# Patient Record
Sex: Female | Born: 1960
Health system: Southern US, Community
[De-identification: ages and names within clinical notes are randomized; demographics above are authoritative.]

## PROBLEM LIST (undated history)

## (undated) DIAGNOSIS — G43909 Migraine, unspecified, not intractable, without status migrainosus: Secondary | ICD-10-CM

## (undated) DIAGNOSIS — B360 Pityriasis versicolor: Secondary | ICD-10-CM

## (undated) DIAGNOSIS — E785 Hyperlipidemia, unspecified: Secondary | ICD-10-CM

## (undated) DIAGNOSIS — F988 Other specified behavioral and emotional disorders with onset usually occurring in childhood and adolescence: Secondary | ICD-10-CM

## (undated) HISTORY — PX: OPEN ANTERIOR SHOULDER RECONSTRUCTION: SHX2100

## (undated) HISTORY — DX: Pityriasis versicolor: B36.0

## (undated) HISTORY — DX: Hyperlipidemia, unspecified: E78.5

## (undated) HISTORY — PX: TUBAL LIGATION: SHX77

## (undated) HISTORY — DX: Other specified behavioral and emotional disorders with onset usually occurring in childhood and adolescence: F98.8

## (undated) HISTORY — DX: Migraine, unspecified, not intractable, without status migrainosus: G43.909

## (undated) HISTORY — PX: SHOULDER ARTHROSCOPY: SHX128

## (undated) HISTORY — PX: LEEP: SHX91

---

## 1999-01-05 ENCOUNTER — Other Ambulatory Visit: Admission: RE | Admit: 1999-01-05 | Discharge: 1999-01-05 | Payer: Self-pay | Admitting: Obstetrics and Gynecology

## 1999-12-29 ENCOUNTER — Other Ambulatory Visit: Admission: RE | Admit: 1999-12-29 | Discharge: 1999-12-29 | Payer: Self-pay | Admitting: Obstetrics and Gynecology

## 2001-01-04 ENCOUNTER — Other Ambulatory Visit: Admission: RE | Admit: 2001-01-04 | Discharge: 2001-01-04 | Payer: Self-pay | Admitting: Obstetrics and Gynecology

## 2001-12-14 ENCOUNTER — Other Ambulatory Visit: Admission: RE | Admit: 2001-12-14 | Discharge: 2001-12-14 | Payer: Self-pay | Admitting: Obstetrics and Gynecology

## 2002-07-12 ENCOUNTER — Inpatient Hospital Stay (HOSPITAL_COMMUNITY): Admission: AD | Admit: 2002-07-12 | Discharge: 2002-07-16 | Payer: Self-pay | Admitting: Obstetrics and Gynecology

## 2002-07-13 ENCOUNTER — Encounter (INDEPENDENT_AMBULATORY_CARE_PROVIDER_SITE_OTHER): Payer: Self-pay

## 2002-08-27 ENCOUNTER — Other Ambulatory Visit: Admission: RE | Admit: 2002-08-27 | Discharge: 2002-08-27 | Payer: Self-pay | Admitting: Obstetrics and Gynecology

## 2003-03-19 ENCOUNTER — Other Ambulatory Visit: Admission: RE | Admit: 2003-03-19 | Discharge: 2003-03-19 | Payer: Self-pay | Admitting: Obstetrics and Gynecology

## 2003-10-06 ENCOUNTER — Inpatient Hospital Stay (HOSPITAL_COMMUNITY): Admission: AD | Admit: 2003-10-06 | Discharge: 2003-10-10 | Payer: Self-pay | Admitting: Obstetrics & Gynecology

## 2003-10-07 ENCOUNTER — Encounter (INDEPENDENT_AMBULATORY_CARE_PROVIDER_SITE_OTHER): Payer: Self-pay | Admitting: Specialist

## 2003-11-11 ENCOUNTER — Other Ambulatory Visit: Admission: RE | Admit: 2003-11-11 | Discharge: 2003-11-11 | Payer: Self-pay | Admitting: Obstetrics and Gynecology

## 2005-03-12 ENCOUNTER — Other Ambulatory Visit: Admission: RE | Admit: 2005-03-12 | Discharge: 2005-03-12 | Payer: Self-pay | Admitting: Obstetrics and Gynecology

## 2008-04-23 ENCOUNTER — Ambulatory Visit: Payer: Self-pay | Admitting: Internal Medicine

## 2008-05-15 ENCOUNTER — Encounter: Admission: RE | Admit: 2008-05-15 | Discharge: 2008-08-07 | Payer: Self-pay | Admitting: Psychologist

## 2008-11-12 ENCOUNTER — Ambulatory Visit: Payer: Self-pay | Admitting: Internal Medicine

## 2008-12-16 ENCOUNTER — Ambulatory Visit: Payer: Self-pay | Admitting: Psychology

## 2009-08-19 ENCOUNTER — Encounter: Admission: RE | Admit: 2009-08-19 | Discharge: 2009-08-19 | Payer: Self-pay | Admitting: Orthopedic Surgery

## 2009-09-03 ENCOUNTER — Ambulatory Visit (HOSPITAL_BASED_OUTPATIENT_CLINIC_OR_DEPARTMENT_OTHER): Admission: RE | Admit: 2009-09-03 | Discharge: 2009-09-04 | Payer: Self-pay | Admitting: Orthopedic Surgery

## 2010-07-21 ENCOUNTER — Other Ambulatory Visit: Payer: Self-pay | Admitting: Internal Medicine

## 2010-07-23 ENCOUNTER — Encounter (INDEPENDENT_AMBULATORY_CARE_PROVIDER_SITE_OTHER): Payer: 59 | Admitting: Internal Medicine

## 2010-07-23 DIAGNOSIS — F329 Major depressive disorder, single episode, unspecified: Secondary | ICD-10-CM

## 2010-07-23 DIAGNOSIS — F3289 Other specified depressive episodes: Secondary | ICD-10-CM

## 2010-07-23 DIAGNOSIS — G43909 Migraine, unspecified, not intractable, without status migrainosus: Secondary | ICD-10-CM

## 2010-08-24 ENCOUNTER — Other Ambulatory Visit: Payer: 59 | Admitting: Internal Medicine

## 2010-09-03 ENCOUNTER — Telehealth: Payer: Self-pay | Admitting: Internal Medicine

## 2010-09-03 DIAGNOSIS — R52 Pain, unspecified: Secondary | ICD-10-CM

## 2010-09-03 MED ORDER — CYCLOBENZAPRINE HCL 10 MG PO TABS: 10.0000 mg | ORAL_TABLET | Freq: Two times a day (BID) | ORAL | Status: AC | PRN

## 2010-09-03 NOTE — Telephone Encounter (Signed)
Call in Flexeri l10 mg #60- one p.o. Bid with prn one year refills

## 2010-09-11 NOTE — Discharge Summary (Signed)
NAME:  Susan Rivas, Susan Rivas                         ACCOUNT NO.:  [id number]   MEDICAL RECORD NO.:  [id number]                   PATIENT TYPE:  INP   LOCATION:  9102                                 FACILITY:  [facility]   PHYSICIAN:  Michelle L. Vincente Poli, M.D.            DATE OF BIRTH:  12/27/1960   DATE OF ADMISSION:  07/12/2002  DATE OF DISCHARGE:  07/16/2002                                 DISCHARGE SUMMARY   ADMITTING DIAGNOSES:  1. Intrauterine pregnancy at term.  2. Spontaneous onset of labor.   DISCHARGE DIAGNOSES:  1. Status post low transverse cesarean section secondary to maternal fever     and nonreassuring fetal heart tones.  2. Viable female infant.   PROCEDURE:  Primary low transverse cesarean section.   REASON FOR ADMISSION:  Please see written H&P.   HOSPITAL COURSE:  The patient was a 50 year old married female primigravida  who was admitted to the [facility] at term in labor.  Cervix was 3 cm  dilated, 80% effaced, vertex presentation.  Artificial rupture of membranes  was performed revealing clear fluid.  Fetal heart tones were noted with  occasional variable deceleration pattern, but overall was reassuring.  Intrauterine pressure catheter was placed for adequate monitoring and fetal  scalp lead was placed.  Epidural was placed for patient's comfort.  Fetal  deceleration pattern continued and amniotic infusion was started with  improvement in pattern.  Later, maternal temperature developed and patient  was placed on IV antibiotics.  Fetal heart rate pattern was noted to have  persistent deceleration pattern and decision was made to proceed with a  cesarean delivery.  The patient was taken to the operating room where  epidural was dosed to an adequate surgical level.  A low transverse incision  was made with the delivery of a viable female infant weighing 6 pounds 1  ounce with Apgars of 8 at one minute, 9 at five minutes.  Umbilical cord pH  was 7.11.  The patient  tolerated procedure well and was taken to the  recovery room in stable condition.  On postoperative day one vital signs  were stable with temperature maximum of 100 degrees during the night with  99.0 degrees Fahrenheit this morning.  Abdomen was soft with good return of  bowel function.  Fundus was firm and mildly tender.  Laboratories revealed  hemoglobin of 10.0 and WBC count of 19.4.  On postoperative day two abdomen  was soft.  Abdominal dressing was removed revealing incision that was clean,  dry, and intact.  Vital signs were stable and patient was afebrile.  The  patient was ambulating well and tolerating a regular diet without complaints  of nausea and vomiting.  Laboratories revealed hemoglobin of 8.6 and WBC  count of 15.3.  On postoperative day three temperature had spiked at 4 p.m.  the previous day to 100.3 with temperature normal at 97.4 at 8 a.m.  Abdomen  was soft.  Fundus was firm and nontender.  Incision was clean, dry, and  intact.  Staples were removed.  CBC was redone and revealed a hemoglobin of  9.1, WBC count of 7.2.  The patient was therefore discharged home.   CONDITION ON DISCHARGE:  Good.   DIET:  Regular, as tolerated.   ACTIVITY:  No heavy lifting.  No driving for two weeks.  No vaginal entry.   FOLLOW UP:  The patient is to follow up in the office in one week for an  incision check.  She is to call for temperature greater than 100 degrees,  persistent nausea and vomiting, heavy vaginal bleeding, and/or redness or  drainage from incisional site.   DISCHARGE MEDICATIONS:  1. Percocet 5/325 numbers 30 one p.o. q.4-6h. p.r.n. pain.  2. Motrin 600 mg q.6h. p.r.n.  3. Colace one p.o. daily p.r.n.  4. Prenatal vitamins one p.o. daily.     Julio Sicks, N.P.                        Stann Mainland. Vincente Poli, M.D.    CC/MEDQ  D:  08/21/2002  T:  08/21/2002  Job:  [id number]

## 2010-09-11 NOTE — Op Note (Signed)
NAMEALESANA, MAGISTRO                         ACCOUNT NO.:  [id number]   MEDICAL RECORD NO.:  [id number]                   PATIENT TYPE:  MAT   LOCATION:  MATC                                 FACILITY:  [facility]   PHYSICIAN:  Freddy Finner, M.D.                DATE OF BIRTH:  09/07/1960   DATE OF PROCEDURE:  10/07/2003  DATE OF DISCHARGE:                                 OPERATIVE REPORT   PREOPERATIVE DIAGNOSES:  1. Intrauterine pregnancy at term, surgically scarred uterus, presents in     labor.  2. Multiparity, requests surgical sterilization.   POSTOPERATIVE DIAGNOSES:  1. Intrauterine pregnancy at term, surgically scarred uterus, presents in     labor.  2. Multiparity, requests surgical sterilization.   OPERATIVE PROCEDURE:  Repeat cesarean delivery with viable female infant, 8  and 9 Apgars, cord pH 7.37, and bilateral tubal ligation.   SURGEON:  Freddy Finner, M.D.   ANESTHESIA:  Spinal.   ESTIMATED INTRAOPERATIVE BLOOD LOSS:  800 mL.   INTRAOPERATIVE COMPLICATIONS:  None.   INDICATION:  The patient presented in labor.  She has a surgically scarred  uterus.  She was originally scheduled for surgery in approximately 2 weeks  but was recently rescheduled for later this week.  She presented in labor  this evening.   INTRAOPERATIVE FINDINGS:  The patient had at least 3 myomas measuring 1.5 to  2.0 cm on the posterior fundus of the uterus.  The fallopian tubes and  ovaries were normal.   DESCRIPTION OF PROCEDURE:  The patient was brought to the operating room,  placed under adequate spinal anesthesia, placed in the dorsal recumbent  position with elevation of the right hip by 15 degrees.  Abdomen was prepped  and draped in usual fashion.  A Foley catheter was placed using sterile  technique.  A lower abdominal transverse incision was made through an old  scar and carried sharply down to fascia.  Fascia was entered sharply and  extended to the extent of the skin incision.   Rectus sheath was developed  superiorly and inferiorly with blunt and sharp dissection.  Rectus muscle  was divided in the midline; this included entering the peritoneum, which was  then extended under direct visualization with blunt and sharp dissection.  Bladder blade was placed.  A transverse incision was made in the lower  uterine segment above the reflection of the bladder, precluding developing  the bladder flap.  Uterine incision was extended transversely with blunt  dissection and membranes was ruptured.  Fetal vertex was floating.  The  incision was T'd and then the Kiwi vacuum device was applied with easy  delivery then of a viable female infant with  Apgars and cord pH noted above.  Placenta and other products of conception were removed from the uterus.  Uterus was delivered into the anterior abdominal wall.  The transverse  incision in the uterus was  closed with a running-locking 0 Monocryl suture.  A figure-of-eight at the right margin was required for complete hemostasis.  A small defect vertically was closed with 2 figure-of-eights of 0 Monocryl.  The midportion of each fallopian tube was then elevated, doubly ligated with  0 plain ties and a segment of tube excised.  The tube distal to the tie was  fulgurated on the mucosal segment.  On attempt to deliver the uterus back  into the abdomen, the tubes avulsed and required re-ligation of each distal  segment; on the right it was ligated with just 0 plain tie and on the left  side, the ties were still intact but there was a defect in the mesosalpinx  and the defect was crossclamped with a Tresa Endo and another 0 plain tie  applied; this produced complete hemostasis.  Irrigation was carried out;  hemostasis throughout was felt to be adequate.  The abdominal incision was  then closed in layers; running 0 Monocryl was used to close the peritoneum  and reapproximate the rectus muscles, fascia was closed with running 0 PDS,  subcutaneous  tissue was approximated with running 2-0 plain, skin was closed  with wide skin staples and quarter-inch Steri-Strips.  Patient tolerated the  operative procedure well and was taken to recovery in good condition.                                               Freddy Finner, M.D.    WRN/MEDQ  D:  10/07/2003  T:  10/07/2003  Job:  [id number]

## 2010-09-11 NOTE — Op Note (Signed)
NAME:  Susan Rivas, Susan Rivas                         ACCOUNT NO.:  [id number]   MEDICAL RECORD NO.:  [id number]                   PATIENT TYPE:  INP   LOCATION:  9102                                 [facility]ACILITY:  [facility]   PHYSICIAN:  Tracie Harrier, M.D.              DATE OF BIRTH:  13-Feb-1961   DATE OF PROCEDURE:  07/13/2002  DATE OF DISCHARGE:                                 OPERATIVE REPORT   PREOPERATIVE DIAGNOSES:  1. Intrauterine pregnancy at term.  2. Maternal fever.  3. Nonreassuring fetal heart tracing.   POSTOPERATIVE DIAGNOSES:  1. Intrauterine pregnancy at term.  2. Maternal fever.  3. Nonreassuring fetal heart tracing.   PROCEDURE:  Primary low transverse cesarean section.   SURGEON:  Tracie Harrier, M.D.   ANESTHESIA:  Epidural.   ESTIMATED BLOOD LOSS:  800 cc.   COMPLICATIONS:  None.   FINDINGS:  At [phone] through a low transverse uterine incision, a viable  female was delivered from the vertex presentation.  Light meconium was  encountered.  The baby did well.   The pelvis was visualized at time of surgery and noted to be normal.  There  was a 3 x 3 cm posterior fundal fibroid noted.   PROCEDURE:  The patient was taken to the operating room where an adequate  level of epidural anesthetic was administered.  The patient was placed on  the operating table in the left lateral tilt position.  The abdomen was  prepped and draped in the usual sterile fashion with Betadine and sterile  drapes.  A Foley catheter had previously been inserted.  The abdomen was  entered through a Pfannenstiel incision and carried down sharply in the  usual fashion.  The peritoneum was atraumatically entered.  The  vesicouterine peritoneum overlying the lower uterine segment was incised and  a bladder flap was bluntly created and sharply created over the lower  uterine segment.  A bladder blade was then placed behind the bladder to  ensure its protection during the procedure.  The uterus  was then entered  through a low transverse incision and carried out laterally using the  operator's fingers.  The membranes were entered with light meconium noted.  The vertex was elevated into the incision and delivered promptly and easily  at 0646.  The oropharynx and nasopharynx was thoroughly bulb suctioned.  The  baby was handed promptly to the pediatricians.  The baby did well with a  strong spontaneous cry.  Apgars were 8 and 9.  The placenta was then removed manually and the interior of the uterus was  wiped clean thoroughly with a wet sponge.  A cord gas was obtained prior to  this with a pH showing 7.11.  Again, the baby did well.   Attention was then turned to closure.  The uterine incision was closed in a  two layer fashion.  The first layer a running interlocking  suture of 1  Vicryl.  A second imbricating suture was placed across the primary suture  line with a running stitch of 1 Vicryl as well.  Good hemostasis was then  noted.  The pelvis was visualized.  A uterine fibroid was noted, 3 x 3 cm on  the posterior aspect and the superior fundal region of the uterus.  Otherwise, the ovaries and tubes appeared to be normal.  The pelvis was then  thoroughly irrigated with copious amounts of irrigant and noted to be  hemostatic.  The rectus muscle and anterior peritoneum was then closed with  a running suture of 1 Vicryl.  The subfascial layers were hemostatic.  The  fascia was then closed with a running suture of 1 Vicryl.  The subcutaneous  tissue was irrigated and made hemostatic using the Bovie cautery.  The skin  reapproximated with staples and a sterile dressing applied.   Final sponge, instrument, and needle counts correct x3.  There were no  perioperative complications.  The patient did receive intraoperative  antibiotic.                                               Tracie Harrier, M.D.    REG/MEDQ  D:  07/13/2002  T:  07/14/2002  Job:  [id number]

## 2010-09-11 NOTE — Discharge Summary (Signed)
Susan Rivas, Susan Rivas                         ACCOUNT NO.:  [id number]   MEDICAL RECORD NO.:  [id number]                   PATIENT TYPE:  INP   LOCATION:  9122                                 [facility]   PHYSICIAN:  Freddy Finner, M.D.                DATE OF BIRTH:  09/16/1960   DATE OF ADMISSION:  10/06/2003  DATE OF DISCHARGE:  10/10/2003                                 DISCHARGE SUMMARY   ADMISSION DIAGNOSES:  1. Intrauterine pregnancy at term.  2. Labor.  3. Previous cesarean section, desires repeat.  4. Multiparity, desires sterilization.   DISCHARGE DIAGNOSES:  1. Status post low transverse cesarean section.  2. Viable female infant.  3. Permanent sterilization.   PROCEDURE:  1. Repeat low transverse cesarean section.  2. Bilateral tubal ligation.   REASON FOR ADMISSION:  Please see written H&P.   HOSPITAL COURSE:  The patient is a 50 year old white married female that  presents to [facility] at 38-1/2 weeks estimated gestational age in  labor.  The patient had a previous cesarean section and was scheduled for  cesarean in approximately 2 weeks.  The patient had previously also  requested permanent sterilization due to multiparity.  The patient was then  transferred to the operating room where spinal anesthesia was admitted  without difficulty.  A low transverse incision was made with the delivery of  a viable female infant weighing 6 pounds, 0 ounces, Apgar's of 8 at 1 minute,  9 at 5 minutes.  Periumbilical cord pH was 7.37.  The patient tolerated the  procedure well and was taken to the recovery room in stable condition.  On  postoperative day 1 patient was without complaint.  Vital signs were stable,  fundus was firm and nontender.  Abdomen was soft with good return of bowel  function.  Abdominal dressing was noted to be clean, dry and intact.  Labs  revealed a hemoglobin of 10.2.   On postoperative day 2 the patient did complain of some soreness; vital  signs were stable with blood pressure 110 to 137/69 to 79.  Abdominal  dressing had been removed revealing an incision that was clean, dry and  intact.  Labs revealed hemoglobin 10.6, WBC count of 6900, platelet count of  229,000.  On postoperative day 3 patient was without complaint.  Vital signs  were stable; fundus was firm and nontender, incision was clean, dry and  intact.  Staples were removed and patient was discharged home.   Condition on discharge good.  Diet regular as tolerated.  Activity - no  heavy lifting, no driving x2 weeks.  No vaginal entry.   FOLLOW UP:  The patient is to follow up in the office in 1-2 weeks for an  incision check.  She is to call for temperature greater than 100 degrees,  persistent nausea and vomiting, heavy vaginal bleeding, any redness or  drainage from incisional  site.   DISCHARGE MEDICATIONS:  1. Percocet 5/325 #30 1 p.o. q.4-6h p.r.n.  2. Motrin 600 mg q.6h.  3. Prenatal vitamins 1 p.o. daily.  4. Colace daily p.r.n.     Julio Sicks, N.P.                        Freddy Finner, M.D.    CC/MEDQ  D:  10/21/2003  T:  10/22/2003  Job:  [id number]

## 2010-10-23 ENCOUNTER — Encounter: Payer: Self-pay | Admitting: Internal Medicine

## 2010-10-27 ENCOUNTER — Encounter: Payer: Self-pay | Admitting: Internal Medicine

## 2010-10-27 ENCOUNTER — Ambulatory Visit (INDEPENDENT_AMBULATORY_CARE_PROVIDER_SITE_OTHER): Payer: 59 | Admitting: Internal Medicine

## 2010-10-27 ENCOUNTER — Ambulatory Visit: Payer: 59 | Admitting: Internal Medicine

## 2010-10-27 VITALS — BP 129/78 | HR 80 | Temp 98.8°F | Ht 65.0 in | Wt 132.0 lb

## 2010-10-27 DIAGNOSIS — R5383 Other fatigue: Secondary | ICD-10-CM

## 2010-10-27 DIAGNOSIS — R5381 Other malaise: Secondary | ICD-10-CM

## 2010-10-27 NOTE — Patient Instructions (Signed)
Continue Zoloft 100 mg daily. We will notify you by letter of your lab results. Return when necessary

## 2010-10-27 NOTE — Progress Notes (Signed)
  Subjective:    Patient ID: Susan Rivas, female    DOB: 1960/11/10, 50 y.o.   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 Recently she tried to obtain re-employment at local hospital but did not get a position. She is a bit down about that. She is on Zoloft 100 mg daily. Says marriage is going okay but she is chronically fatigued and sleeps 12-14 hours at a time some days. Has 2 children. Drives one hour to and from work. Worried about anemia. Baseline hemoglobin for years has been around 11.6 g    Review of Systems appetite okay. Energy level decreased. No headaches. No myalgias.     Objective:   Physical Exam neck is supple without thyromegaly; chest is clear, cardiac exam: regular rate and rhythm. TMs and pharynx are clear. Brief neurological exam no focal deficits.        Assessment & Plan:  1-fatigue--- etiology unclear other than busy schedule working full-time and being a mother to 2 children. Some dysthymia which has been chronic since losing job with local hospital. Did take attention deficit medication for a while but does not want to be back on that. In my opinion, has a combination of depression, attention deficit, and learning disability. This is affected her self esteem a great deal. Does not want to go to counseling at this point in time. Have obtained anemia studies today and repeat thyroid functions. The thyroid functions were normal in March 2012. Says she would like to go to [employer] school but husband does not think they can afford it right now. Consider job change.

## 2010-10-28 LAB — CBC WITH DIFFERENTIAL/PLATELET
Eosinophils Relative: 1 % (ref 0–5)
Hemoglobin: 11.8 g/dL — ABNORMAL LOW (ref 12.0–15.0)
Lymphocytes Relative: 38 % (ref 12–46)
Lymphs Abs: 2 10*3/uL (ref 0.7–4.0)
MCH: 28.2 pg (ref 26.0–34.0)
MCHC: 32.9 g/dL (ref 30.0–36.0)
MCV: 85.9 fL (ref 78.0–100.0)
Monocytes Absolute: 0.4 10*3/uL (ref 0.1–1.0)
Monocytes Relative: 7 % (ref 3–12)
Neutro Abs: 2.9 10*3/uL (ref 1.7–7.7)
Neutrophils Relative %: 54 % (ref 43–77)
Platelets: 295 10*3/uL (ref 150–400)

## 2010-10-28 LAB — IRON AND TIBC: UIBC: 416 ug/dL

## 2010-10-29 ENCOUNTER — Encounter: Payer: Self-pay | Admitting: Internal Medicine

## 2011-01-15 ENCOUNTER — Other Ambulatory Visit: Payer: 59 | Admitting: Internal Medicine

## 2011-01-18 ENCOUNTER — Other Ambulatory Visit: Payer: 59 | Admitting: Internal Medicine

## 2011-01-18 ENCOUNTER — Ambulatory Visit: Payer: 59 | Admitting: Internal Medicine

## 2011-01-18 DIAGNOSIS — E785 Hyperlipidemia, unspecified: Secondary | ICD-10-CM

## 2011-01-18 DIAGNOSIS — E611 Iron deficiency: Secondary | ICD-10-CM

## 2011-01-18 LAB — LIPID PANEL
Cholesterol: 223 mg/dL — ABNORMAL HIGH (ref 0–200)
HDL: 59 mg/dL (ref >39)
LDL Cholesterol: 147 mg/dL — ABNORMAL HIGH (ref 0–99)
Total CHOL/HDL Ratio: 3.8 Ratio
Triglycerides: 83 mg/dL (ref <150)
VLDL: 17 mg/dL (ref 0–40)

## 2011-01-19 ENCOUNTER — Ambulatory Visit: Payer: 59 | Admitting: Internal Medicine

## 2011-01-21 ENCOUNTER — Encounter: Payer: Self-pay | Admitting: Internal Medicine

## 2011-01-21 ENCOUNTER — Ambulatory Visit (INDEPENDENT_AMBULATORY_CARE_PROVIDER_SITE_OTHER): Payer: 59 | Admitting: Internal Medicine

## 2011-01-21 DIAGNOSIS — R5381 Other malaise: Secondary | ICD-10-CM

## 2011-01-21 DIAGNOSIS — F3289 Other specified depressive episodes: Secondary | ICD-10-CM

## 2011-01-21 DIAGNOSIS — E611 Iron deficiency: Secondary | ICD-10-CM | POA: Insufficient documentation

## 2011-01-21 DIAGNOSIS — E785 Hyperlipidemia, unspecified: Secondary | ICD-10-CM

## 2011-01-21 DIAGNOSIS — F329 Major depressive disorder, single episode, unspecified: Secondary | ICD-10-CM

## 2011-01-21 DIAGNOSIS — F32A Depression, unspecified: Secondary | ICD-10-CM | POA: Insufficient documentation

## 2011-01-21 DIAGNOSIS — R5383 Other fatigue: Secondary | ICD-10-CM

## 2011-01-21 NOTE — Progress Notes (Signed)
  Subjective:    Patient ID: Susan Rivas, female    DOB: Oct 22, 1960, 50 y.o.   MRN: [id number]  HPI patient here today to followup on iron deficiency and hyperlipidemia. Says she's been following a fairly strict low fat diet. 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 Continues to have some fatigue issues. Has 2 small children. 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    Review of Systems     Objective:   Physical Exam chest clear; cardiac exam regular rate and rhythm, no thyromegaly        Assessment & Plan:  Hyperlipidemia-fasting lipid panel reviewed with her. No significant change in my opinion although the numbers are slightly lower. Family history of hyperlipidemia in her mother. She may wind up on lipid-lowering medication  Depression related to employment and low self-esteem  History of iron deficiency-iron level now low normal. She is taking iron supplementation once a day and should increase that probably to twice daily. Return in 6 months for checkup and fasting lab work including lipid panel. Will get flu shot through her employment

## 2011-02-14 ENCOUNTER — Inpatient Hospital Stay (INDEPENDENT_AMBULATORY_CARE_PROVIDER_SITE_OTHER)
Admission: RE | Admit: 2011-02-14 | Discharge: 2011-02-14 | Disposition: A | Discharge status: Home / Self Care | Payer: 59 | Source: Ambulatory Visit | Attending: Family Medicine | Admitting: Family Medicine

## 2011-02-14 DIAGNOSIS — H8309 Labyrinthitis, unspecified ear: Secondary | ICD-10-CM

## 2011-02-21 ENCOUNTER — Other Ambulatory Visit: Payer: Self-pay | Admitting: Internal Medicine

## 2011-05-09 ENCOUNTER — Other Ambulatory Visit: Payer: Self-pay | Admitting: Internal Medicine

## 2011-07-16 ENCOUNTER — Other Ambulatory Visit: Payer: 59 | Admitting: Internal Medicine

## 2011-07-19 ENCOUNTER — Encounter: Payer: 59 | Admitting: Internal Medicine

## 2011-09-09 ENCOUNTER — Ambulatory Visit (INDEPENDENT_AMBULATORY_CARE_PROVIDER_SITE_OTHER): Payer: 59 | Admitting: Internal Medicine

## 2011-09-09 ENCOUNTER — Encounter: Payer: Self-pay | Admitting: Internal Medicine

## 2011-09-09 VITALS — BP 122/80 | HR 76 | Temp 97.6°F | Ht 64.5 in | Wt 135.0 lb

## 2011-09-09 DIAGNOSIS — F329 Major depressive disorder, single episode, unspecified: Secondary | ICD-10-CM

## 2011-09-09 DIAGNOSIS — R82998 Other abnormal findings in urine: Secondary | ICD-10-CM

## 2011-09-09 DIAGNOSIS — Z0000 Encounter for general adult medical examination without abnormal findings: Secondary | ICD-10-CM

## 2011-09-09 DIAGNOSIS — F32A Depression, unspecified: Secondary | ICD-10-CM

## 2011-09-09 DIAGNOSIS — F3289 Other specified depressive episodes: Secondary | ICD-10-CM

## 2011-09-09 DIAGNOSIS — R5381 Other malaise: Secondary | ICD-10-CM

## 2011-09-09 DIAGNOSIS — F988 Other specified behavioral and emotional disorders with onset usually occurring in childhood and adolescence: Secondary | ICD-10-CM

## 2011-09-09 DIAGNOSIS — R5383 Other fatigue: Secondary | ICD-10-CM

## 2011-09-09 LAB — POCT URINALYSIS DIPSTICK
Bilirubin, UA: NEGATIVE
Glucose, UA: NEGATIVE
Ketones, UA: NEGATIVE
Protein, UA: NEGATIVE
Spec Grav, UA: 1.01
Urobilinogen, UA: NEGATIVE
pH, UA: 7.5

## 2011-09-09 LAB — COMPREHENSIVE METABOLIC PANEL
ALT: 9 U/L (ref 0–35)
Albumin: 4.6 g/dL (ref 3.5–5.2)
Total Bilirubin: 0.5 mg/dL (ref 0.3–1.2)
Total Protein: 6.9 g/dL (ref 6.0–8.3)

## 2011-09-09 LAB — LIPID PANEL
Cholesterol: 250 mg/dL — ABNORMAL HIGH (ref 0–200)
Total CHOL/HDL Ratio: 4.1 Ratio
Triglycerides: 82 mg/dL (ref <150)
VLDL: 16 mg/dL (ref 0–40)

## 2011-09-09 NOTE — Progress Notes (Signed)
  Subjective:    Patient ID: Susan Rivas, female    DOB: [birth year], 51 y.o.   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 She has a son age 68 and a daughter age 62. She works part-time for [employer]. Takes Zoloft for anxiety depression. Patient complains of weight gain. Gained 12 pounds since 2010. Doesn't get much exercise. History of migraine headaches, attention deficit and hyperlipidemia.  Had LEEP procedure for abnormal Pap smear 1992, arthroscopy right shoulder 1990, right shoulder reconstruction 1991, bilateral tubal ligation 2005.  Intolerant of codeine and hydrocodone  Had Tdap immunization July 2010. Dr. Rana Snare is GYN physician.  Married 2 children. Social alcohol consumption. Does not smoke.  Had episode of vertigo October 2012 requiring emergency department visit.  Family history: Father died of natural causes. One sister in good health. Mother living with history of hyperlipidemia.    Review of Systems  Constitutional: Positive for fatigue.  HENT: Negative.   Eyes: Negative.   Respiratory: Negative.   Cardiovascular: Negative.   Gastrointestinal: Negative.   Genitourinary: Negative.   Musculoskeletal: Negative.   Neurological: Negative.   Hematological: Negative.   Psychiatric/Behavioral: Negative.        Objective:   Physical Exam  Nursing note and vitals reviewed. Constitutional: She is oriented to person, place, and time. She appears well-developed and well-nourished. No distress.  HENT:  Head: Normocephalic and atraumatic.  Right Ear: External ear normal.  Left Ear: External ear normal.  Mouth/Throat: Oropharynx is clear and moist.  Eyes: Conjunctivae and EOM are normal. Pupils are equal, round, and reactive to light. Right eye exhibits no discharge. Left eye exhibits no discharge. No scleral icterus.  Neck: Normal range of motion. Neck supple. No JVD present. No tracheal deviation present. No thyromegaly  present.  Cardiovascular: Normal rate, regular rhythm and intact distal pulses.   Pulmonary/Chest: She has no wheezes. She has no rales.  Abdominal: Soft. Bowel sounds are normal. She exhibits no distension and no mass. There is no tenderness. There is no rebound and no guarding.  Genitourinary:       Deferred to GYN  Musculoskeletal: Normal range of motion. She exhibits no edema.  Lymphadenopathy:    She has no cervical adenopathy.  Neurological: She is alert and oriented to person, place, and time. She has normal reflexes. No cranial nerve deficit.  Skin: Skin is warm and dry. No rash noted. She is not diaphoretic.  Psychiatric: She has a normal mood and affect. Her behavior is normal. Judgment and thought content normal.           Assessment & Plan:  History of migraine headaches  Hyperlipidemia  History of attention deficit  History of depression  Plan: Patient encouraged diet exercise and lose weight. Repeat lipid panel in 6 months with office visit. Continue Zoloft for  depression.  Urinalysis is abnormal. Culture will be obtained.

## 2011-09-10 ENCOUNTER — Encounter: Payer: Self-pay | Admitting: Internal Medicine

## 2011-09-10 LAB — CBC WITH DIFFERENTIAL/PLATELET
Eosinophils Absolute: 0 10*3/uL (ref 0.0–0.7)
Eosinophils Relative: 0 % (ref 0–5)
Lymphocytes Relative: 36 % (ref 12–46)
Lymphs Abs: 1.8 10*3/uL (ref 0.7–4.0)
MCH: 28.3 pg (ref 26.0–34.0)
Neutro Abs: 2.9 10*3/uL (ref 1.7–7.7)
Neutrophils Relative %: 58 % (ref 43–77)
Platelets: 306 10*3/uL (ref 150–400)
RBC: 4.38 MIL/uL (ref 3.87–5.11)

## 2011-09-11 LAB — URINE CULTURE: Colony Count: 45000

## 2011-09-23 ENCOUNTER — Other Ambulatory Visit: Payer: Self-pay | Admitting: Internal Medicine

## 2012-01-24 ENCOUNTER — Other Ambulatory Visit: Payer: Self-pay

## 2012-01-24 MED ORDER — SERTRALINE HCL 100 MG PO TABS: 100.0000 mg | ORAL_TABLET | Freq: Every day | ORAL | Status: DC

## 2012-03-13 ENCOUNTER — Other Ambulatory Visit: Payer: 59 | Admitting: Internal Medicine

## 2012-03-14 ENCOUNTER — Ambulatory Visit: Payer: 59 | Admitting: Internal Medicine

## 2012-05-27 ENCOUNTER — Other Ambulatory Visit: Payer: Self-pay | Admitting: Internal Medicine

## 2012-09-20 ENCOUNTER — Other Ambulatory Visit: Payer: Self-pay | Admitting: Obstetrics and Gynecology

## 2012-09-20 DIAGNOSIS — R928 Other abnormal and inconclusive findings on diagnostic imaging of breast: Secondary | ICD-10-CM

## 2012-09-28 ENCOUNTER — Other Ambulatory Visit: Payer: Self-pay | Admitting: Obstetrics and Gynecology

## 2012-09-28 ENCOUNTER — Ambulatory Visit
Admission: RE | Admit: 2012-09-28 | Discharge: 2012-09-28 | Disposition: A | Discharge status: Home / Self Care | Payer: 59 | Source: Ambulatory Visit | Attending: Obstetrics and Gynecology | Admitting: Obstetrics and Gynecology

## 2012-09-28 DIAGNOSIS — R928 Other abnormal and inconclusive findings on diagnostic imaging of breast: Secondary | ICD-10-CM

## 2012-10-04 ENCOUNTER — Other Ambulatory Visit (HOSPITAL_COMMUNITY): Payer: Self-pay | Admitting: Diagnostic Radiology

## 2012-10-04 ENCOUNTER — Ambulatory Visit
Admission: RE | Admit: 2012-10-04 | Discharge: 2012-10-04 | Disposition: A | Discharge status: Home / Self Care | Payer: 59 | Source: Ambulatory Visit | Attending: Obstetrics and Gynecology | Admitting: Obstetrics and Gynecology

## 2012-10-04 DIAGNOSIS — R928 Other abnormal and inconclusive findings on diagnostic imaging of breast: Secondary | ICD-10-CM

## 2013-03-05 ENCOUNTER — Encounter: Payer: Self-pay | Admitting: Internal Medicine

## 2013-03-05 ENCOUNTER — Ambulatory Visit (INDEPENDENT_AMBULATORY_CARE_PROVIDER_SITE_OTHER): Payer: 59 | Admitting: Internal Medicine

## 2013-03-05 VITALS — BP 112/74 | HR 68 | Temp 97.4°F | Ht 64.5 in | Wt 131.0 lb

## 2013-03-05 DIAGNOSIS — N390 Urinary tract infection, site not specified: Secondary | ICD-10-CM

## 2013-03-05 DIAGNOSIS — F988 Other specified behavioral and emotional disorders with onset usually occurring in childhood and adolescence: Secondary | ICD-10-CM

## 2013-03-05 LAB — POCT URINALYSIS DIPSTICK
Ketones, UA: NEGATIVE
Nitrite, UA: NEGATIVE

## 2013-03-05 NOTE — Progress Notes (Signed)
  Subjective:    Patient ID: Susan Rivas, female    DOB: 11-15-1960, 52 y.o.   MRN: [id number]  HPI Patient says she was treated about 3 weeks ago at a [facility] for urinary tract infection. Says she was given Bactrim for 3 days. Says symptoms improved but she wanted to check and make sure urinary tract infection had resolved. She's no longer symptomatic. What she is really here for is to have me place her on Vyvanse for attention deficit disorder. I told her I did not want to prescribe this medication for her. She has appointment to  see Dr. Evelene Croon in January but wants to start the medication before then. Told her she might to be placed on cancellation list to see Dr. Evelene Croon  but I prefer Dr. Evelene Croon handle this. She has long-standing history of attention deficit issues and learning disability. She's had many jobs. 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 She has on average 7 patients to care for which is a bit overwhelming.    Review of Systems     Objective:   Physical Exam Not examined. Urinalysis shows no evidence of UTI       Assessment & Plan:  UTI resolved  Attention deficit disorder  Plan: Keep appointment with Dr. Evelene Croon for medication for attention deficit.

## 2013-03-05 NOTE — Patient Instructions (Signed)
Urinary tract infection has resolved. Keep appointment with Dr. Evelene Croon for attention deficit disorder

## 2014-06-17 ENCOUNTER — Other Ambulatory Visit: Payer: Self-pay | Admitting: Obstetrics and Gynecology

## 2014-06-18 ENCOUNTER — Other Ambulatory Visit: Payer: Self-pay | Admitting: Obstetrics and Gynecology

## 2014-06-18 DIAGNOSIS — R921 Mammographic calcification found on diagnostic imaging of breast: Secondary | ICD-10-CM

## 2014-06-18 LAB — CYTOLOGY - PAP

## 2014-06-26 ENCOUNTER — Ambulatory Visit
Admission: RE | Admit: 2014-06-26 | Discharge: 2014-06-26 | Disposition: A | Discharge status: Home / Self Care | Payer: 59 | Source: Ambulatory Visit | Attending: Obstetrics and Gynecology | Admitting: Obstetrics and Gynecology

## 2014-06-26 DIAGNOSIS — R921 Mammographic calcification found on diagnostic imaging of breast: Secondary | ICD-10-CM

## 2014-09-13 ENCOUNTER — Encounter: Payer: Self-pay | Admitting: Internal Medicine

## 2015-06-06 ENCOUNTER — Other Ambulatory Visit: Payer: Self-pay

## 2015-06-06 DIAGNOSIS — Z1231 Encounter for screening mammogram for malignant neoplasm of breast: Secondary | ICD-10-CM

## 2015-06-27 ENCOUNTER — Ambulatory Visit
Admission: RE | Admit: 2015-06-27 | Discharge: 2015-06-27 | Disposition: A | Discharge status: Home / Self Care | Payer: 59 | Source: Ambulatory Visit

## 2015-06-27 DIAGNOSIS — Z1231 Encounter for screening mammogram for malignant neoplasm of breast: Secondary | ICD-10-CM

## 2015-07-14 ENCOUNTER — Encounter: Payer: Self-pay | Admitting: Pediatrics

## 2016-07-08 ENCOUNTER — Other Ambulatory Visit: Payer: Self-pay | Admitting: Internal Medicine

## 2016-07-08 DIAGNOSIS — Z1231 Encounter for screening mammogram for malignant neoplasm of breast: Secondary | ICD-10-CM

## 2016-07-27 ENCOUNTER — Ambulatory Visit: Payer: 59

## 2016-07-30 ENCOUNTER — Ambulatory Visit
Admission: RE | Admit: 2016-07-30 | Discharge: 2016-07-30 | Disposition: A | Discharge status: Home / Self Care | Payer: 59 | Source: Ambulatory Visit | Attending: Internal Medicine | Admitting: Internal Medicine

## 2016-07-30 DIAGNOSIS — Z1231 Encounter for screening mammogram for malignant neoplasm of breast: Secondary | ICD-10-CM | POA: Diagnosis not present

## 2016-09-02 DIAGNOSIS — Z01419 Encounter for gynecological examination (general) (routine) without abnormal findings: Secondary | ICD-10-CM | POA: Diagnosis not present

## 2016-09-02 DIAGNOSIS — Z6824 Body mass index (BMI) 24.0-24.9, adult: Secondary | ICD-10-CM | POA: Diagnosis not present

## 2016-09-20 DIAGNOSIS — N390 Urinary tract infection, site not specified: Secondary | ICD-10-CM | POA: Diagnosis not present

## 2017-03-22 DIAGNOSIS — N390 Urinary tract infection, site not specified: Secondary | ICD-10-CM | POA: Diagnosis not present

## 2017-03-22 DIAGNOSIS — R300 Dysuria: Secondary | ICD-10-CM | POA: Diagnosis not present

## 2017-04-21 ENCOUNTER — Encounter: Payer: Self-pay | Admitting: Internal Medicine

## 2017-04-21 ENCOUNTER — Ambulatory Visit (INDEPENDENT_AMBULATORY_CARE_PROVIDER_SITE_OTHER): Payer: BLUE CROSS/BLUE SHIELD | Admitting: Internal Medicine

## 2017-04-21 VITALS — BP 122/82 | HR 76 | Wt 146.0 lb

## 2017-04-21 DIAGNOSIS — R51 Headache: Secondary | ICD-10-CM | POA: Diagnosis not present

## 2017-04-21 DIAGNOSIS — Z8669 Personal history of other diseases of the nervous system and sense organs: Secondary | ICD-10-CM | POA: Diagnosis not present

## 2017-04-21 DIAGNOSIS — R519 Headache, unspecified: Secondary | ICD-10-CM

## 2017-04-21 LAB — CBC WITH DIFFERENTIAL/PLATELET
BASOS PCT: 0.2 %
Basophils Absolute: 11 cells/uL (ref 0–200)
Eosinophils Absolute: 50 cells/uL (ref 15–500)
Eosinophils Relative: 0.9 %
HCT: 37.2 % (ref 35.0–45.0)
HEMOGLOBIN: 12.3 g/dL (ref 11.7–15.5)
Lymphs Abs: 2363 cells/uL (ref 850–3900)
MCH: 29.2 pg (ref 27.0–33.0)
MCHC: 33.1 g/dL (ref 32.0–36.0)
MCV: 88.4 fL (ref 80.0–100.0)
MPV: 9.6 fL (ref 7.5–12.5)
Monocytes Relative: 8.1 %
NEUTROS ABS: 2722 {cells}/uL (ref 1500–7800)
Neutrophils Relative %: 48.6 %
PLATELETS: 290 10*3/uL (ref 140–400)
RBC: 4.21 10*6/uL (ref 3.80–5.10)
RDW: 12.4 % (ref 11.0–15.0)
TOTAL LYMPHOCYTE: 42.2 %
WBC: 5.6 10*3/uL (ref 3.8–10.8)
WBCMIX: 454 {cells}/uL (ref 200–950)

## 2017-04-21 MED ORDER — PREDNISONE 10 MG PO TABS
ORAL_TABLET | ORAL | 0 refills | Status: DC
Start: 2017-04-21 — End: 2017-05-12

## 2017-04-21 MED ORDER — HYDROCODONE-ACETAMINOPHEN 10-325 MG PO TABS: 1.0000 | ORAL_TABLET | Freq: Four times a day (QID) | ORAL | 0 refills | Status: DC | PRN

## 2017-04-21 NOTE — Progress Notes (Signed)
   Subjective:    Patient ID: Susan Rivas, female    DOB: 12-26-1960, 56 y.o.   MRN: [id number]  HPI   Pt has not been here since 2014.  Complaining of headache for 3 days.  Says she has lots of headaches but not usually this prolonged.  Did not have photophobia or scotomata before having onset of headache.  Denies being under excessive stress.  No nausea or vomiting.  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  She likes the job.  She is married.  Has a daughter and son.    Review of Systems see above     Objective:   Physical Exam No nystagmus.  PERRLA.  Funduscopic exam is benign bilaterally.  Cranial nerves II through XII are grossly intact.  Deep tendon reflexes 2+ and symmetrical in muscle strength in the upper and lower extremities is normal.  Finger to nose testing within normal limits.  Gait is normal.       Assessment & Plan:  Protracted headache-likely migraine  Plan: CBC with differential.  Hydrocodone APAP 10/325 1 p.o. every 6 hours as needed headache pain.  Prednisone 10 mg (#21) going from 60 mg to 0 mg over 7 days.  Rest and drink plenty of fluids.  Call if not better in 24-48 hours or sooner if worse.

## 2017-04-25 DIAGNOSIS — Z8669 Personal history of other diseases of the nervous system and sense organs: Secondary | ICD-10-CM | POA: Insufficient documentation

## 2017-04-25 NOTE — Patient Instructions (Signed)
Take hydrocodone APAP as directed for headache pain.  Take prednisone and tapering course for intractable headache.  Call if symptoms do not improve in 24-48 hours or sooner if worse.  CBC with differential drawn.

## 2017-05-12 ENCOUNTER — Encounter: Payer: Self-pay | Admitting: Internal Medicine

## 2017-05-12 ENCOUNTER — Ambulatory Visit: Payer: BLUE CROSS/BLUE SHIELD | Admitting: Internal Medicine

## 2017-05-12 VITALS — BP 140/90 | HR 90 | Temp 98.8°F | Wt 146.0 lb

## 2017-05-12 DIAGNOSIS — N390 Urinary tract infection, site not specified: Secondary | ICD-10-CM

## 2017-05-12 DIAGNOSIS — R8290 Unspecified abnormal findings in urine: Secondary | ICD-10-CM | POA: Diagnosis not present

## 2017-05-12 DIAGNOSIS — R300 Dysuria: Secondary | ICD-10-CM | POA: Diagnosis not present

## 2017-05-12 DIAGNOSIS — R319 Hematuria, unspecified: Secondary | ICD-10-CM

## 2017-05-12 LAB — POCT URINALYSIS DIPSTICK
Bilirubin, UA: NEGATIVE
Glucose, UA: NEGATIVE
Ketones, UA: NEGATIVE
NITRITE UA: POSITIVE
PH UA: 6 (ref 5.0–8.0)
SPEC GRAV UA: 1.01 (ref 1.010–1.025)
Urobilinogen, UA: 0.2 E.U./dL

## 2017-05-12 MED ORDER — CIPROFLOXACIN HCL 500 MG PO TABS: 500.0000 mg | ORAL_TABLET | Freq: Two times a day (BID) | ORAL | 0 refills | Status: DC

## 2017-05-12 MED ORDER — FLUCONAZOLE 150 MG PO TABS: 150.0000 mg | ORAL_TABLET | Freq: Once | ORAL | 0 refills | Status: AC

## 2017-05-12 NOTE — Progress Notes (Signed)
   Subjective:    Patient ID: Susan Rivas, female    DOB: 07/27/1960, 57 y.o.   MRN: [id number]  HPI  57 year old Female with UTI symptoms onset a couple of days ago.  Has discomfort over her bladder.  Not so much dysuria.  However has seen blood in her urine recently with the symptoms.  [facility] and at [facility] approximately October 2017.  We do not have culture results or note from [facility] and [facility] does not do cultures apparently.  No nausea ,vomiting, back pain, fever or chills    Review of Systems see above     Objective:   Physical Exam  No CVA tenderness.  Urinalysis abnormal.  Culture sent      Assessment & Plan:  Acute UTI  Plan: Cipro 500 mg twice daily for 10 days.

## 2017-05-12 NOTE — Patient Instructions (Signed)
Cipro 500 mg twice daily for 7 days.  Culture pending. 

## 2017-05-15 LAB — URINE CULTURE
MICRO NUMBER: 90072491
SPECIMEN QUALITY: ADEQUATE

## 2017-06-23 ENCOUNTER — Ambulatory Visit: Payer: 59 | Admitting: Internal Medicine

## 2017-06-23 ENCOUNTER — Encounter: Payer: Self-pay | Admitting: Internal Medicine

## 2017-06-23 VITALS — BP 144/90 | HR 104 | Temp 98.1°F | Ht 65.0 in | Wt 145.0 lb

## 2017-06-23 DIAGNOSIS — B349 Viral infection, unspecified: Secondary | ICD-10-CM | POA: Diagnosis not present

## 2017-06-23 DIAGNOSIS — J22 Unspecified acute lower respiratory infection: Secondary | ICD-10-CM

## 2017-06-23 DIAGNOSIS — R6883 Chills (without fever): Secondary | ICD-10-CM

## 2017-06-23 DIAGNOSIS — R0981 Nasal congestion: Secondary | ICD-10-CM | POA: Diagnosis not present

## 2017-06-23 DIAGNOSIS — R059 Cough, unspecified: Secondary | ICD-10-CM

## 2017-06-23 DIAGNOSIS — R05 Cough: Secondary | ICD-10-CM | POA: Diagnosis not present

## 2017-06-23 LAB — POCT INFLUENZA A/B
INFLUENZA A, POC: NEGATIVE
Influenza B, POC: NEGATIVE

## 2017-06-23 MED ORDER — HYDROCOD POLST-CPM POLST ER 10-8 MG/5ML PO SUER: 5.0000 mL | Freq: Two times a day (BID) | ORAL | 0 refills | Status: DC | PRN

## 2017-06-23 MED ORDER — DOXYCYCLINE HYCLATE 100 MG PO TABS: 100.0000 mg | ORAL_TABLET | Freq: Two times a day (BID) | ORAL | 0 refills | Status: DC

## 2017-06-23 NOTE — Progress Notes (Signed)
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  She has had myalgias chills or fever.  Has had deep congested cough.  Has malaise and fatigue.  Did take flu vaccine.  No headache or stiff neck.  O: Skin warm and dry.  Nodes none.  Pharynx is clear.  Neck is supple.  Chest clear to auscultation without rales or wheezing but she does have a deep congested cough that is productive.  Rapid flu test is negative  Assessment:  Viral syndrome Acute bronchitis  Plan: Doxycycline 100 mg twice daily for 10 days.  Tussionex 1 teaspoon p.o. every 12 hours as needed cough.  Rest and drink plenty of fluids.  Note to be out of work.

## 2017-06-23 NOTE — Patient Instructions (Signed)
Doxycycline 100 mg twice daily for 10 days.  Tussionex 1 teaspoon p.o. every 12 hours as needed cough.  Rest and drink plenty of fluids.  Note to be out of work.

## 2017-12-13 DIAGNOSIS — R300 Dysuria: Secondary | ICD-10-CM | POA: Diagnosis not present

## 2018-05-04 ENCOUNTER — Ambulatory Visit (INDEPENDENT_AMBULATORY_CARE_PROVIDER_SITE_OTHER): Payer: 59 | Admitting: Internal Medicine

## 2018-05-04 ENCOUNTER — Encounter: Payer: Self-pay | Admitting: Internal Medicine

## 2018-05-04 VITALS — BP 120/80 | HR 88 | Temp 98.9°F | Ht 65.0 in | Wt 143.0 lb

## 2018-05-04 DIAGNOSIS — R8290 Unspecified abnormal findings in urine: Secondary | ICD-10-CM | POA: Diagnosis not present

## 2018-05-04 DIAGNOSIS — N3941 Urge incontinence: Secondary | ICD-10-CM

## 2018-05-04 DIAGNOSIS — R3915 Urgency of urination: Secondary | ICD-10-CM | POA: Diagnosis not present

## 2018-05-04 DIAGNOSIS — R350 Frequency of micturition: Secondary | ICD-10-CM | POA: Diagnosis not present

## 2018-05-04 LAB — POCT URINALYSIS DIPSTICK
BILIRUBIN UA: NEGATIVE
Glucose, UA: NEGATIVE
Ketones, UA: NEGATIVE
NITRITE UA: NEGATIVE
PH UA: 6.5 (ref 5.0–8.0)
PROTEIN UA: NEGATIVE
Spec Grav, UA: 1.01 (ref 1.010–1.025)
Urobilinogen, UA: 0.2 E.U./dL

## 2018-05-04 MED ORDER — CIPROFLOXACIN HCL 500 MG PO TABS: 500.0000 mg | ORAL_TABLET | Freq: Two times a day (BID) | ORAL | 0 refills | Status: DC

## 2018-05-04 NOTE — Progress Notes (Signed)
   Subjective:    Patient ID: Susan Rivas, female    DOB: [birth year], 58 y.o.   MRN: [id number]  HPI 58 year old Female in today with urinary urgency.  This is worse than usual.  She does have some urgency but this seems to be much worse over the past several days.  No fever or chills.  No significant dysuria.  Urinalysis by dipstick is abnormal.  Culture was sent.    Review of Systems see above-no nausea or vomiting and no back pain     Objective:   Physical Exam  No CVA tenderness      Assessment & Plan:  Urinary urgency  Abnormal urinalysis  Plan: This is consistent with urethritis or cystitis.  Treat with Cipro 500 mg twice daily for 10 days and follow-up with repeat urinalysis in 10 days to 2 weeks.  Discussion regarding urge urinary incontinence.  Prescription medications do not always help and cause dry mouth.  Recommend frequent voiding every couple of hours.

## 2018-05-05 LAB — URINE CULTURE
MICRO NUMBER: 33825
RESULT: NO GROWTH
SPECIMEN QUALITY: ADEQUATE

## 2018-05-06 NOTE — Patient Instructions (Signed)
Cipro 500 mg twice daily for 10 days with follow-up in approximately 2 weeks.  Urine culture pending.  Try to void every 2-4 hours to prevent urinary urge incontinence.

## 2018-05-16 ENCOUNTER — Ambulatory Visit: Payer: 59 | Admitting: Internal Medicine

## 2018-05-16 ENCOUNTER — Encounter: Payer: Self-pay | Admitting: Internal Medicine

## 2018-05-16 VITALS — BP 140/80 | HR 93 | Temp 98.6°F | Ht 65.0 in | Wt 144.0 lb

## 2018-05-16 DIAGNOSIS — R300 Dysuria: Secondary | ICD-10-CM | POA: Diagnosis not present

## 2018-05-16 DIAGNOSIS — N3941 Urge incontinence: Secondary | ICD-10-CM | POA: Diagnosis not present

## 2018-05-16 DIAGNOSIS — R8290 Unspecified abnormal findings in urine: Secondary | ICD-10-CM

## 2018-05-16 LAB — POCT URINALYSIS DIPSTICK
Bilirubin, UA: NEGATIVE
Glucose, UA: NEGATIVE
Ketones, UA: NEGATIVE
Nitrite, UA: NEGATIVE
PH UA: 6.5 (ref 5.0–8.0)
Protein, UA: NEGATIVE
Spec Grav, UA: 1.01 (ref 1.010–1.025)
UROBILINOGEN UA: 0.2 U/dL

## 2018-05-16 MED ORDER — CIPROFLOXACIN HCL 500 MG PO TABS: 500.0000 mg | ORAL_TABLET | Freq: Two times a day (BID) | ORAL | 1 refills | Status: DC

## 2018-05-16 MED ORDER — CYCLOBENZAPRINE HCL 10 MG PO TABS: 10.0000 mg | ORAL_TABLET | Freq: Three times a day (TID) | ORAL | 0 refills | Status: DC | PRN

## 2018-05-16 NOTE — Progress Notes (Signed)
   Subjective:    Patient ID: Susan Rivas, female    DOB: 1960/07/02, 58 y.o.   MRN: [id number]  HPI  58 year old Female with history of UTIs and [facility] and [facility] not done.  Previously had one in January 2019 treated here and urine culture grew E. coli.  Thinks that UTIs are related to sexual intercourse.  She does urinate after intercourse.  We do not have any information on other UTIs that [facility] when she was [facility].  Does not feel that this UTI has completely resolved and is asking to continue Cipro.  Feels better.  Reminded her that urine culture at this time did not have any growth although she was symptomatic.  She should consider seeing urologist if symptoms persist.  She wants to see how she gets along.    Review of Systems she does have some urge urinary incontinence.  She is been trying to urinate on a more regular timed basis     Objective:   Physical Exam  Not examined but spent 15 minutes speaking with her about these issues.      Assessment & Plan:  Dysuria-improved with Cipro  Plan: Have refilled Cipro and if she continues to be symptomatic she will need to see urologist.  She also understands it is important to get a culture each time she is symptomatic to see if she truly has a UTI or some other issues such as interstitial cystitis.  Urge urinary incontinence-talked with her about medications but these are not real good options since they cause dry mouth and do not work all that well.  She will try to void on a more regular basis.  She knows to urinate after sexual intercourse.  There is trace LE on urine dipstick today.  We did not reculture urine.  She will be given additional course of Cipro.

## 2018-05-21 NOTE — Patient Instructions (Signed)
Cipro refilled.  Dipstick UA shows trace LE.  If symptoms persist, you will be referred to urologist.  Try to void on a regular basis particularly at work.  Please try to void after intercourse.  We need to have culture proven UTIs from now on.

## 2018-05-23 ENCOUNTER — Other Ambulatory Visit: Payer: Self-pay | Admitting: Internal Medicine

## 2018-05-23 DIAGNOSIS — Z1231 Encounter for screening mammogram for malignant neoplasm of breast: Secondary | ICD-10-CM

## 2018-06-23 ENCOUNTER — Ambulatory Visit
Admission: RE | Admit: 2018-06-23 | Discharge: 2018-06-23 | Disposition: A | Discharge status: Home / Self Care | Payer: 59 | Source: Ambulatory Visit | Attending: Internal Medicine | Admitting: Internal Medicine

## 2018-06-23 DIAGNOSIS — Z1231 Encounter for screening mammogram for malignant neoplasm of breast: Secondary | ICD-10-CM

## 2018-10-13 ENCOUNTER — Other Ambulatory Visit: Payer: 59

## 2018-10-13 ENCOUNTER — Telehealth: Payer: Self-pay

## 2018-10-13 ENCOUNTER — Ambulatory Visit (INDEPENDENT_AMBULATORY_CARE_PROVIDER_SITE_OTHER): Payer: 59 | Admitting: Internal Medicine

## 2018-10-13 ENCOUNTER — Telehealth: Payer: Self-pay | Admitting: *Deleted

## 2018-10-13 ENCOUNTER — Telehealth: Payer: Self-pay | Admitting: Internal Medicine

## 2018-10-13 DIAGNOSIS — Z20822 Contact with and (suspected) exposure to covid-19: Secondary | ICD-10-CM

## 2018-10-13 DIAGNOSIS — R519 Headache, unspecified: Secondary | ICD-10-CM

## 2018-10-13 DIAGNOSIS — R51 Headache: Secondary | ICD-10-CM

## 2018-10-13 MED ORDER — ONDANSETRON HCL 4 MG PO TABS: 4.0000 mg | ORAL_TABLET | Freq: Three times a day (TID) | ORAL | 0 refills | Status: DC | PRN

## 2018-10-13 MED ORDER — PREDNISONE 10 MG PO TABS: ORAL_TABLET | ORAL | 0 refills | Status: DC

## 2018-10-13 MED ORDER — HYDROCODONE-ACETAMINOPHEN 10-325 MG PO TABS: 1.0000 | ORAL_TABLET | Freq: Four times a day (QID) | ORAL | 0 refills | Status: DC | PRN

## 2018-10-13 MED ORDER — HYDROCODONE-ACETAMINOPHEN 10-325 MG PO TABS: 1.0000 | ORAL_TABLET | Freq: Three times a day (TID) | ORAL | 0 refills | Status: AC | PRN

## 2018-10-13 NOTE — Telephone Encounter (Signed)
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 Testing referred by Dr. Tedra Senegal.    Pt called and scheduled for testing at the [facility] site on 10/13/18. Pt advised to wear a mask and remain in car at the time of appt. Pt verbalized understanding.

## 2018-10-13 NOTE — Telephone Encounter (Signed)
Pt scheduled for testing on 10/13/18 at [facility] site

## 2018-10-13 NOTE — Telephone Encounter (Signed)
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

## 2018-10-13 NOTE — Progress Notes (Signed)
   Subjective:    Patient ID: Susan Rivas, female    DOB: 10-08-1960, 58 y.o.   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 No fever or chills. 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 Cannot rule out Covid exposure so sent her to tent at [facility] for testing which is pending. Had headaches for years thought to be migraine but says they are getting worse. She would like to see Neurologist so referral will be made.        Review of Systems see above     Objective:   Physical Exam  Cranial nerves II through XII are grossly intact.  She has no facial weakness.  She is alert and oriented x3.  Able to move all 4 extremities.      Assessment & Plan:  Protracted headache for 5 days-likely migraine headache status migrainosus.  Treat with tapering course of prednisone going from 60 mg to 0 mg over 7 days.  Norco 10/325 sparingly for headache.  Neurology consultation to evaluate migraine headaches.  COVID-19 testing ordered at testing center on [place].  Addendum: We have finally gotten confirmation today June 25 that her COVID 19 test is negative.  I have provided work note for her indicating why she has been out of work.  She may pick this up tomorrow.  She was left a message regarding her results today.

## 2018-10-13 NOTE — Telephone Encounter (Signed)
Scheduled virtual visit °

## 2018-10-13 NOTE — Telephone Encounter (Signed)
This should be a virtual visit

## 2018-10-13 NOTE — Telephone Encounter (Signed)
Susan Rivas [phone]  [name] called to say that she has had a headache for a week, no other symptoms, would like to come in to office for a visit.

## 2018-10-17 ENCOUNTER — Telehealth: Payer: Self-pay | Admitting: Internal Medicine

## 2018-10-17 ENCOUNTER — Encounter: Payer: Self-pay | Admitting: Internal Medicine

## 2018-10-17 NOTE — Telephone Encounter (Signed)
Covid 19 results from testing center not back yet. Headache has resolved. Have written note for work as pt requested pending results

## 2018-10-18 ENCOUNTER — Encounter: Payer: Self-pay | Admitting: Internal Medicine

## 2018-10-18 ENCOUNTER — Telehealth: Payer: Self-pay | Admitting: Internal Medicine

## 2018-10-18 LAB — NOVEL CORONAVIRUS, NAA: SARS-CoV-2, NAA: NOT DETECTED

## 2018-10-18 NOTE — Telephone Encounter (Signed)
Phoned results to pt re Covid 19 testing: Not detected. May return to work.

## 2018-10-18 NOTE — Patient Instructions (Addendum)
Take prednisone in tapering course as directed for protracted headache.  Norco 10/325 sparingly for pain.  COVID-19 testing ordered at [facility] location.  Neurology referral placed to evaluate recurrent protracted headaches.

## 2018-11-01 ENCOUNTER — Encounter: Payer: Self-pay | Admitting: Neurology

## 2018-11-01 ENCOUNTER — Ambulatory Visit: Payer: 59 | Admitting: Neurology

## 2018-11-01 ENCOUNTER — Other Ambulatory Visit: Payer: Self-pay

## 2018-11-01 VITALS — BP 128/76 | HR 96 | Temp 96.9°F | Ht 65.0 in | Wt 145.0 lb

## 2018-11-01 DIAGNOSIS — G43001 Migraine without aura, not intractable, with status migrainosus: Secondary | ICD-10-CM

## 2018-11-01 DIAGNOSIS — R51 Headache with orthostatic component, not elsewhere classified: Secondary | ICD-10-CM

## 2018-11-01 DIAGNOSIS — R519 Headache, unspecified: Secondary | ICD-10-CM

## 2018-11-01 DIAGNOSIS — G43109 Migraine with aura, not intractable, without status migrainosus: Secondary | ICD-10-CM

## 2018-11-01 DIAGNOSIS — G441 Vascular headache, not elsewhere classified: Secondary | ICD-10-CM | POA: Diagnosis not present

## 2018-11-01 DIAGNOSIS — H5462 Unqualified visual loss, left eye, normal vision right eye: Secondary | ICD-10-CM

## 2018-11-01 MED ORDER — RIZATRIPTAN BENZOATE 10 MG PO TBDP: 10.0000 mg | ORAL_TABLET | ORAL | 11 refills | Status: DC | PRN

## 2018-11-01 MED ORDER — ALPRAZOLAM 0.25 MG PO TABS: ORAL_TABLET | ORAL | 0 refills | Status: DC

## 2018-11-01 NOTE — Patient Instructions (Addendum)
Rizatriptan: Please take one tablet at the onset of your headache. If it does not improve the symptoms please take one additional tablet. Do not take more then 2 tablets in 24hrs. Do not take use more then 2 to 3 times in a week.  MRI of the brain w/wo contrast   Migraine Headache A migraine headache is an intense, throbbing pain on one side or both sides of the head. Migraine headaches may also cause other symptoms, such as nausea, vomiting, and sensitivity to light and noise. A migraine headache can last from 4 hours to 3 days. Talk with your doctor about what things may bring on (trigger) your migraine headaches. What are the causes? The exact cause of this condition is not known. However, a migraine may be caused when nerves in the brain become irritated and release chemicals that cause inflammation of blood vessels. This inflammation causes pain. This condition may be triggered or caused by:  Drinking alcohol.  Smoking.  Taking medicines, such as: ? Medicine used to treat chest pain (nitroglycerin). ? Birth control pills. ? Estrogen. ? Certain blood pressure medicines.  Eating or drinking products that contain nitrates, glutamate, aspartame, or tyramine. Aged cheeses, chocolate, or caffeine may also be triggers.  Doing physical activity. Other things that may trigger a migraine headache include:  Menstruation.  Pregnancy.  Hunger.  Stress.  Lack of sleep or too much sleep.  Weather changes.  Fatigue. What increases the risk? The following factors may make you more likely to experience migraine headaches:  Being a certain age. This condition is more common in people who are 80-54 years old.  Being female.  Having a family history of migraine headaches.  Being Caucasian.  Having a mental health condition, such as depression or anxiety.  Being obese. What are the signs or symptoms? The main symptom of this condition is pulsating or throbbing pain. This pain may:   Happen in any area of the head, such as on one side or both sides.  Interfere with daily activities.  Get worse with physical activity.  Get worse with exposure to bright lights or loud noises. Other symptoms may include:  Nausea.  Vomiting.  Dizziness.  General sensitivity to bright lights, loud noises, or smells. Before you get a migraine headache, you may get warning signs (an aura). An aura may include:  Seeing flashing lights or having blind spots.  Seeing bright spots, halos, or zigzag lines.  Having tunnel vision or blurred vision.  Having numbness or a tingling feeling.  Having trouble talking.  Having muscle weakness. Some people have symptoms after a migraine headache (postdromal phase), such as:  Feeling tired.  Difficulty concentrating. How is this diagnosed? A migraine headache can be diagnosed based on:  Your symptoms.  A physical exam.  Tests, such as: ? CT scan or an MRI of the head. These imaging tests can help rule out other causes of headaches. ? Taking fluid from the spine (lumbar puncture) and analyzing it (cerebrospinal fluid analysis, or CSF analysis). How is this treated? This condition may be treated with medicines that:  Relieve pain.  Relieve nausea.  Prevent migraine headaches. Treatment for this condition may also include:  Acupuncture.  Lifestyle changes like avoiding foods that trigger migraine headaches.  Biofeedback.  Cognitive behavioral therapy. Follow these instructions at home: Medicines  Take over-the-counter and prescription medicines only as told by your health care provider.  Ask your health care provider if the medicine prescribed to you: ? Requires  you to avoid driving or using heavy machinery. ? Can cause constipation. You may need to take these actions to prevent or treat constipation:  Drink enough fluid to keep your urine pale yellow.  Take over-the-counter or prescription medicines.  Eat foods  that are high in fiber, such as beans, whole grains, and fresh fruits and vegetables.  Limit foods that are high in fat and processed sugars, such as fried or sweet foods. Lifestyle  Do not drink alcohol.  Do not use any products that contain nicotine or tobacco, such as cigarettes, e-cigarettes, and chewing tobacco. If you need help quitting, ask your health care provider.  Get at least 8 hours of sleep every night.  Find ways to manage stress, such as meditation, deep breathing, or yoga. General instructions      Keep a journal to find out what may trigger your migraine headaches. For example, write down: ? What you eat and drink. ? How much sleep you get. ? Any change to your diet or medicines.  If you have a migraine headache: ? Avoid things that make your symptoms worse, such as bright lights. ? It may help to lie down in a dark, quiet room. ? Do not drive or use heavy machinery. ? Ask your health care provider what activities are safe for you while you are experiencing symptoms.  Keep all follow-up visits as told by your health care provider. This is important. Contact a health care provider if:  You develop symptoms that are different or more severe than your usual migraine headache symptoms.  You have more than 15 headache days in one month. Get help right away if:  Your migraine headache becomes severe.  Your migraine headache lasts longer than 72 hours.  You have a fever.  You have a stiff neck.  You have vision loss.  Your muscles feel weak or like you cannot control them.  You start to lose your balance often.  You have trouble walking.  You faint.  You have a seizure. Summary  A migraine headache is an intense, throbbing pain on one side or both sides of the head. Migraines may also cause other symptoms, such as nausea, vomiting, and sensitivity to light and noise.  This condition may be treated with medicines and lifestyle changes. You may also  need to avoid certain things that trigger a migraine headache.  Keep a journal to find out what may trigger your migraine headaches.  Contact your health care provider if you have more than 15 headache days in a month or you develop symptoms that are different or more severe than your usual migraine headache symptoms. This information is not intended to replace advice given to you by your health care provider. Make sure you discuss any questions you have with your health care provider. Document Released: 04/12/2005 Document Revised: 08/04/2018 Document Reviewed: 05/25/2018 Elsevier Patient Education  2020 Elsevier Inc.  Alprazolam tablets What is this medicine? ALPRAZOLAM (al PRAY zoe lam) is a benzodiazepine. It is used to treat anxiety and panic attacks. This medicine may be used for other purposes; ask your health care provider or pharmacist if you have questions. COMMON BRAND NAME(S): Xanax What should I tell my health care provider before I take this medicine? They need to know if you have any of these conditions:  an alcohol or drug abuse problem  bipolar disorder, depression, psychosis or other mental health conditions  glaucoma  kidney or liver disease  lung or breathing disease  myasthenia gravis  Parkinson's disease  porphyria  seizures or a history of seizures  suicidal thoughts  an unusual or allergic reaction to alprazolam, other benzodiazepines, foods, dyes, or preservatives  pregnant or trying to get pregnant  breast-feeding How should I use this medicine? Take this medicine by mouth with a glass of water. Follow the directions on the prescription label. Take your medicine at regular intervals. Do not take it more often than directed. Do not stop taking except on your doctor's advice. A special MedGuide will be given to you by the pharmacist with each prescription and refill. Be sure to read this information carefully each time. Talk to your pediatrician  regarding the use of this medicine in children. Special care may be needed. Overdosage: If you think you have taken too much of this medicine contact a poison control center or emergency room at once. NOTE: This medicine is only for you. Do not share this medicine with others. What if I miss a dose? If you miss a dose, take it as soon as you can. If it is almost time for your next dose, take only that dose. Do not take double or extra doses. What may interact with this medicine? Do not take this medicine with any of the following medications:  certain antiviral medicines for HIV or AIDS like delavirdine, indinavir  certain medicines for fungal infections like ketoconazole and itraconazole  narcotic medicines for cough  sodium oxybate This medicine may also interact with the following medications:  alcohol  antihistamines for allergy, cough and cold  certain antibiotics like clarithromycin, erythromycin, isoniazid, rifampin, rifapentine, rifabutin, and troleandomycin  certain medicines for blood pressure, heart disease, irregular heart beat  certain medicines for depression, like amitriptyline, fluoxetine, sertraline  certain medicines for seizures like carbamazepine, oxcarbazepine, phenobarbital, phenytoin, primidone  cimetidine  cyclosporine  female hormones, like estrogens or progestins and birth control pills, patches, rings, or injections  general anesthetics like halothane, isoflurane, methoxyflurane, propofol  grapefruit juice  local anesthetics like lidocaine, pramoxine, tetracaine  medicines that relax muscles for surgery  narcotic medicines for pain  other antiviral medicines for HIV or AIDS  phenothiazines like chlorpromazine, mesoridazine, prochlorperazine, thioridazine This list may not describe all possible interactions. Give your health care provider a list of all the medicines, herbs, non-prescription drugs, or dietary supplements you use. Also tell them  if you smoke, drink alcohol, or use illegal drugs. Some items may interact with your medicine. What should I watch for while using this medicine? Tell your doctor or health care professional if your symptoms do not start to get better or if they get worse. Do not stop taking except on your doctor's advice. You may develop a severe reaction. Your doctor will tell you how much medicine to take. You may get drowsy or dizzy. Do not drive, use machinery, or do anything that needs mental alertness until you know how this medicine affects you. To reduce the risk of dizzy and fainting spells, do not stand or sit up quickly, especially if you are an older patient. Alcohol may increase dizziness and drowsiness. Avoid alcoholic drinks. If you are taking another medicine that also causes drowsiness, you may have more side effects. Give your health care provider a list of all medicines you use. Your doctor will tell you how much medicine to take. Do not take more medicine than directed. Call emergency for help if you have problems breathing or unusual sleepiness. What side effects may I notice from receiving this  medicine? Side effects that you should report to your doctor or health care professional as soon as possible:  allergic reactions like skin rash, itching or hives, swelling of the face, lips, or tongue  breathing problems  confusion  loss of balance or coordination  signs and symptoms of low blood pressure like dizziness; feeling faint or lightheaded, falls; unusually weak or tired  suicidal thoughts or other mood changes Side effects that usually do not require medical attention (report to your doctor or health care professional if they continue or are bothersome):  dizziness  dry mouth  nausea, vomiting  tiredness This list may not describe all possible side effects. Call your doctor for medical advice about side effects. You may report side effects to FDA at 1-800-FDA-1088. Where should  I keep my medicine? Keep out of the reach of children. This medicine can be abused. Keep your medicine in a safe place to protect it from theft. Do not share this medicine with anyone. Selling or giving away this medicine is dangerous and against the law. Store at room temperature between 20 and 25 degrees C (68 and 77 degrees F). This medicine may cause accidental overdose and death if taken by other adults, children, or pets. Mix any unused medicine with a substance like cat litter or coffee grounds. Then throw the medicine away in a sealed container like a sealed bag or a coffee can with a lid. Do not use the medicine after the expiration date. NOTE: This sheet is a summary. It may not cover all possible information. If you have questions about this medicine, talk to your doctor, pharmacist, or health care provider.  2020 Elsevier/Gold Standard (2015-01-09 13:47:25)
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
## 2018-11-02 DIAGNOSIS — G43109 Migraine with aura, not intractable, without status migrainosus: Secondary | ICD-10-CM | POA: Insufficient documentation

## 2018-11-02 DIAGNOSIS — G43001 Migraine without aura, not intractable, with status migrainosus: Secondary | ICD-10-CM | POA: Insufficient documentation

## 2018-11-02 LAB — TSH: TSH: 1.56 u[IU]/mL (ref 0.450–4.500)

## 2018-11-02 LAB — CBC WITH DIFFERENTIAL/PLATELET
Basophils Absolute: 0 10*3/uL (ref 0.0–0.2)
Basos: 1 %
EOS (ABSOLUTE): 0 10*3/uL (ref 0.0–0.4)
Eos: 1 %
Hematocrit: 40.4 % (ref 34.0–46.6)
Hemoglobin: 12.9 g/dL (ref 11.1–15.9)
Immature Grans (Abs): 0 10*3/uL (ref 0.0–0.1)
Immature Granulocytes: 0 %
Lymphocytes Absolute: 1.7 10*3/uL (ref 0.7–3.1)
Lymphs: 36 %
MCH: 29.1 pg (ref 26.6–33.0)
MCHC: 31.9 g/dL (ref 31.5–35.7)
MCV: 91 fL (ref 79–97)
Monocytes Absolute: 0.4 10*3/uL (ref 0.1–0.9)
Monocytes: 8 %
Neutrophils Absolute: 2.6 10*3/uL (ref 1.4–7.0)
Neutrophils: 54 %
Platelets: 290 10*3/uL (ref 150–450)
RBC: 4.43 x10E6/uL (ref 3.77–5.28)
RDW: 13.2 % (ref 11.7–15.4)
WBC: 4.8 10*3/uL (ref 3.4–10.8)

## 2018-11-02 LAB — COMPREHENSIVE METABOLIC PANEL
ALT: 13 IU/L (ref 0–32)
AST: 15 IU/L (ref 0–40)
Albumin/Globulin Ratio: 1.9 (ref 1.2–2.2)
Albumin: 4.5 g/dL (ref 3.8–4.9)
Alkaline Phosphatase: 93 IU/L (ref 39–117)
BUN/Creatinine Ratio: 20 (ref 9–23)
BUN: 13 mg/dL (ref 6–24)
Bilirubin Total: 0.3 mg/dL (ref 0.0–1.2)
CO2: 26 mmol/L (ref 20–29)
Calcium: 10.4 mg/dL — ABNORMAL HIGH (ref 8.7–10.2)
Chloride: 100 mmol/L (ref 96–106)
Creatinine, Ser: 0.65 mg/dL (ref 0.57–1.00)
GFR calc Af Amer: 116 mL/min/{1.73_m2} (ref >59)
GFR calc non Af Amer: 100 mL/min/{1.73_m2} (ref >59)
Globulin, Total: 2.4 g/dL (ref 1.5–4.5)
Glucose: 82 mg/dL (ref 65–99)
Potassium: 4.3 mmol/L (ref 3.5–5.2)
Sodium: 141 mmol/L (ref 134–144)
Total Protein: 6.9 g/dL (ref 6.0–8.5)

## 2018-11-06 ENCOUNTER — Telehealth: Payer: Self-pay | Admitting: Neurology

## 2018-11-06 NOTE — Telephone Encounter (Signed)
LVM for pt to call back about scheduling mri  [employer] auth: [employer] via [employer] webiste

## 2018-11-15 NOTE — Telephone Encounter (Signed)
Patient returned my call she is scheduled for 11/28/18 at [facility].

## 2018-11-28 ENCOUNTER — Other Ambulatory Visit: Payer: Self-pay

## 2018-11-28 ENCOUNTER — Ambulatory Visit: Payer: 59

## 2018-11-28 DIAGNOSIS — G441 Vascular headache, not elsewhere classified: Secondary | ICD-10-CM

## 2018-11-28 DIAGNOSIS — R51 Headache with orthostatic component, not elsewhere classified: Secondary | ICD-10-CM

## 2018-11-28 DIAGNOSIS — G43001 Migraine without aura, not intractable, with status migrainosus: Secondary | ICD-10-CM

## 2018-11-28 DIAGNOSIS — R519 Headache, unspecified: Secondary | ICD-10-CM

## 2018-11-28 DIAGNOSIS — H5462 Unqualified visual loss, left eye, normal vision right eye: Secondary | ICD-10-CM | POA: Diagnosis not present

## 2018-11-28 MED ORDER — GADOBENATE DIMEGLUMINE 529 MG/ML IV SOLN
14.0000 mL | Freq: Once | INTRAVENOUS | Status: AC | PRN
  Administered 2018-11-28: 14 mL via INTRAVENOUS

## 2018-11-29 ENCOUNTER — Other Ambulatory Visit: Payer: Self-pay | Admitting: Neurology

## 2018-11-29 MED ORDER — METOCLOPRAMIDE HCL 10 MG PO TABS: 10.0000 mg | ORAL_TABLET | Freq: Four times a day (QID) | ORAL | 8 refills | Status: DC | PRN

## 2018-11-29 MED ORDER — SUMATRIPTAN SUCCINATE 50 MG PO TABS: 50.0000 mg | ORAL_TABLET | ORAL | 6 refills | Status: DC | PRN

## 2019-01-16 ENCOUNTER — Telehealth: Payer: Self-pay | Admitting: Internal Medicine

## 2019-01-16 NOTE — Telephone Encounter (Signed)
Pt wants to get a refill on cyclobenzaprine (FLEXERIL) 10 MG tablet

## 2019-01-16 NOTE — Addendum Note (Signed)
Addended by: Mady Haagensen on: 01/16/2019 02:27 PM   Modules accepted: Orders

## 2019-01-16 NOTE — Telephone Encounter (Signed)
Left message to call me back need to book CPE, last CPE was in 2013.

## 2019-01-19 ENCOUNTER — Telehealth: Payer: Self-pay

## 2019-01-19 NOTE — Telephone Encounter (Signed)
Left message to book CPE and labs on 01/31/19

## 2019-01-29 ENCOUNTER — Other Ambulatory Visit: Payer: Self-pay

## 2019-01-29 ENCOUNTER — Other Ambulatory Visit: Payer: 59 | Admitting: Internal Medicine

## 2019-01-29 DIAGNOSIS — E785 Hyperlipidemia, unspecified: Secondary | ICD-10-CM

## 2019-01-29 DIAGNOSIS — Z0000 Encounter for general adult medical examination without abnormal findings: Secondary | ICD-10-CM

## 2019-01-29 DIAGNOSIS — Z1329 Encounter for screening for other suspected endocrine disorder: Secondary | ICD-10-CM

## 2019-01-29 DIAGNOSIS — F988 Other specified behavioral and emotional disorders with onset usually occurring in childhood and adolescence: Secondary | ICD-10-CM

## 2019-01-29 DIAGNOSIS — Z1321 Encounter for screening for nutritional disorder: Secondary | ICD-10-CM

## 2019-01-30 LAB — LIPID PANEL
Cholesterol: 281 mg/dL — ABNORMAL HIGH (ref <200)
HDL: 68 mg/dL (ref >=50)
LDL Cholesterol (Calc): 190 mg/dL (calc) — ABNORMAL HIGH
Non-HDL Cholesterol (Calc): 213 mg/dL (calc) — ABNORMAL HIGH (ref <130)
Total CHOL/HDL Ratio: 4.1 (calc) (ref <5.0)
Triglycerides: 107 mg/dL (ref <150)

## 2019-01-30 LAB — VITAMIN D 25 HYDROXY (VIT D DEFICIENCY, FRACTURES): Vit D, 25-Hydroxy: 54 ng/mL (ref 30–100)

## 2019-01-30 LAB — CBC WITH DIFFERENTIAL/PLATELET
Absolute Monocytes: 365 cells/uL (ref 200–950)
Basophils Absolute: 19 cells/uL (ref 0–200)
Basophils Relative: 0.4 %
Eosinophils Absolute: 29 cells/uL (ref 15–500)
Eosinophils Relative: 0.6 %
HCT: 37.4 % (ref 35.0–45.0)
Hemoglobin: 12.6 g/dL (ref 11.7–15.5)
Lymphs Abs: 1675 cells/uL (ref 850–3900)
MCH: 29.8 pg (ref 27.0–33.0)
MCHC: 33.7 g/dL (ref 32.0–36.0)
MCV: 88.4 fL (ref 80.0–100.0)
MPV: 10 fL (ref 7.5–12.5)
Monocytes Relative: 7.6 %
Neutro Abs: 2712 cells/uL (ref 1500–7800)
Neutrophils Relative %: 56.5 %
Platelets: 281 10*3/uL (ref 140–400)
RBC: 4.23 10*6/uL (ref 3.80–5.10)
RDW: 12.3 % (ref 11.0–15.0)
Total Lymphocyte: 34.9 %
WBC: 4.8 10*3/uL (ref 3.8–10.8)

## 2019-01-30 LAB — COMPLETE METABOLIC PANEL WITH GFR
AG Ratio: 1.8 (calc) (ref 1.0–2.5)
ALT: 12 U/L (ref 6–29)
AST: 17 U/L (ref 10–35)
Albumin: 4.6 g/dL (ref 3.6–5.1)
Alkaline phosphatase (APISO): 91 U/L (ref 37–153)
BUN: 12 mg/dL (ref 7–25)
CO2: 27 mmol/L (ref 20–32)
Calcium: 9.4 mg/dL (ref 8.6–10.4)
Chloride: 101 mmol/L (ref 98–110)
Creat: 0.67 mg/dL (ref 0.50–1.05)
GFR, Est African American: 114 mL/min/{1.73_m2} (ref >=60)
GFR, Est Non African American: 98 mL/min/{1.73_m2} (ref >=60)
Globulin: 2.5 g/dL (calc) (ref 1.9–3.7)
Glucose, Bld: 85 mg/dL (ref 65–99)
Potassium: 4 mmol/L (ref 3.5–5.3)
Sodium: 139 mmol/L (ref 135–146)
Total Bilirubin: 0.5 mg/dL (ref 0.2–1.2)
Total Protein: 7.1 g/dL (ref 6.1–8.1)

## 2019-01-30 LAB — TSH: TSH: 0.91 mIU/L (ref 0.40–4.50)

## 2019-01-31 ENCOUNTER — Ambulatory Visit (INDEPENDENT_AMBULATORY_CARE_PROVIDER_SITE_OTHER): Payer: 59 | Admitting: Internal Medicine

## 2019-01-31 ENCOUNTER — Encounter: Payer: Self-pay | Admitting: Internal Medicine

## 2019-01-31 ENCOUNTER — Other Ambulatory Visit: Payer: Self-pay | Admitting: Internal Medicine

## 2019-01-31 ENCOUNTER — Other Ambulatory Visit: Payer: Self-pay

## 2019-01-31 VITALS — BP 120/80 | HR 94 | Wt 142.0 lb

## 2019-01-31 DIAGNOSIS — E7800 Pure hypercholesterolemia, unspecified: Secondary | ICD-10-CM | POA: Diagnosis not present

## 2019-01-31 DIAGNOSIS — Z8669 Personal history of other diseases of the nervous system and sense organs: Secondary | ICD-10-CM

## 2019-01-31 DIAGNOSIS — Z0000 Encounter for general adult medical examination without abnormal findings: Secondary | ICD-10-CM | POA: Diagnosis not present

## 2019-01-31 DIAGNOSIS — R8290 Unspecified abnormal findings in urine: Secondary | ICD-10-CM

## 2019-01-31 NOTE — Progress Notes (Signed)
Subjective:    Patient ID: Susan Rivas, female    DOB: Apr 28, 1960, 58 y.o.   MRN: [id number]  HPI 58 year old Female for health maintenance exam and evaluation of medical issues.  She has a history of migraine headaches and has seen Dr. Jaynee Eagles for extensive evaluation in July.  Was advised to use Maxalt for acute management.  Had MRI of the brain which was normal.  Was given reading material by Dr. Jaynee Eagles regarding migraines.  Says migraines have improved.  Past medical history: LEEP procedure for abnormal Pap smear 1992, arthroscopy right shoulder 1990, right shoulder reconstruction 1991, bilateral tubal ligation 20.  Intolerant of codeine and hydrocodone.  Dr. Corinna Capra is GYN physician.  Social history: She is married to a Agricultural consultant.  2 children a son and a daughter.  Does not smoke.  Social alcohol consumption.  One episode of vertigo October 2012 requiring emergency department visit.  Family history: Father died of natural causes.  1 sister in good health.  Mother with history of hyperlipidemia.           Review of Systems  Constitutional: Negative.   Respiratory: Negative.   Cardiovascular: Negative.   Gastrointestinal: Negative.   Genitourinary: Negative.   Neurological:       History of migraine headaches  Psychiatric/Behavioral: Negative.        Objective:   Physical Exam Vitals signs reviewed.  Constitutional:      Appearance: Normal appearance.  HENT:     Head: Normocephalic and atraumatic.     Right Ear: Tympanic membrane and ear canal normal.     Left Ear: Tympanic membrane and ear canal normal.     Nose: Nose normal.     Mouth/Throat:     Mouth: Mucous membranes are moist.     Pharynx: Oropharynx is clear.  Eyes:     General: No scleral icterus.    Extraocular Movements: Extraocular movements intact.     Conjunctiva/sclera: Conjunctivae normal.     Pupils: Pupils are equal, round, and reactive to light.  Neck:     Musculoskeletal: Neck supple.   Vascular: No carotid bruit.     Comments: No thyromegaly Cardiovascular:     Rate and Rhythm: Normal rate and regular rhythm.     Heart sounds: Normal heart sounds. No murmur.  Pulmonary:     Effort: Pulmonary effort is normal.     Comments: Breast without masses Abdominal:     General: Bowel sounds are normal. There is no distension.     Palpations: Abdomen is soft. There is no mass.     Tenderness: There is no guarding.  Genitourinary:    Comments: Deferred to GYN  Musculoskeletal:     Right lower leg: No edema.     Left lower leg: No edema.  Lymphadenopathy:     Cervical: No cervical adenopathy.  Skin:    General: Skin is warm and dry.     Findings: No rash.  Neurological:     General: No focal deficit present.     Mental Status: She is alert and oriented to person, place, and time.     Cranial Nerves: No cranial nerve deficit.     Motor: No weakness.     Coordination: Coordination normal.     Gait: Gait normal.  Psychiatric:        Mood and Affect: Mood normal.        Behavior: Behavior normal.        Thought  Content: Thought content normal.        Judgment: Judgment normal.           Assessment & Plan:  Pure hypercholesterolemia.  LDL was 190 and total cholesterol 281.  HDL is 68 and triglycerides 107.  Return in January for follow-up.  Migraine headaches treated with as needed Maxalt  Abnormal urine dipstick with daily.  Culture sent.  Patient asymptomatic.  Plan: She has a follow-up appointment in January regarding hyperlipidemia.  We will try diet and exercise.  She may well need statin medication at least 3 times weekly.

## 2019-02-01 LAB — POCT URINALYSIS DIPSTICK
Bilirubin, UA: NEGATIVE
Glucose, UA: NEGATIVE
Ketones, UA: NEGATIVE
Nitrite, UA: NEGATIVE
Protein, UA: NEGATIVE
Spec Grav, UA: 1.01 (ref 1.010–1.025)
Urobilinogen, UA: NEGATIVE E.U./dL — AB
pH, UA: 5 (ref 5.0–8.0)

## 2019-02-06 LAB — PAT ID TIQ DOC: Test Affected: 395

## 2019-02-06 LAB — URINE CULTURE: MICRO NUMBER:: 978963

## 2019-02-06 LAB — HOUSE ACCOUNT TRACKING

## 2019-02-18 ENCOUNTER — Encounter: Payer: Self-pay | Admitting: Internal Medicine

## 2019-02-18 NOTE — Patient Instructions (Addendum)
Watch diet and try to get some exercise.  Follow-up with lipid panel in January.  Continue Maxalt as needed for migraine headaches.  Flu vaccine through employment.  Urine culture sent due to abnormal urine dipstick but patient is asymptomatic.

## 2019-03-19 ENCOUNTER — Other Ambulatory Visit: Payer: Self-pay

## 2019-03-19 ENCOUNTER — Telehealth: Payer: Self-pay | Admitting: Internal Medicine

## 2019-03-19 MED ORDER — ROSUVASTATIN CALCIUM 5 MG PO TABS: ORAL_TABLET | ORAL | 0 refills | Status: DC

## 2019-03-19 NOTE — Telephone Encounter (Signed)
We are calling in Crestor 5 mg 3 times a week

## 2019-03-19 NOTE — Telephone Encounter (Signed)
Susan Rivas [phone]  [facility] [place], [place] AT [place] [phone] (Phone) [phone] (Fax)   Crestor  [name] said that when she was in that Dr Renold Genta was going to call in some Crestor for her to try.

## 2019-03-29 ENCOUNTER — Ambulatory Visit: Payer: 59 | Admitting: Internal Medicine

## 2019-03-29 ENCOUNTER — Other Ambulatory Visit: Payer: Self-pay

## 2019-03-29 ENCOUNTER — Encounter: Payer: Self-pay | Admitting: Internal Medicine

## 2019-03-29 ENCOUNTER — Telehealth: Payer: Self-pay | Admitting: Internal Medicine

## 2019-03-29 VITALS — BP 120/80 | HR 90 | Temp 98.0°F | Ht 65.0 in | Wt 145.0 lb

## 2019-03-29 DIAGNOSIS — R350 Frequency of micturition: Secondary | ICD-10-CM | POA: Diagnosis not present

## 2019-03-29 DIAGNOSIS — N390 Urinary tract infection, site not specified: Secondary | ICD-10-CM

## 2019-03-29 DIAGNOSIS — R8290 Unspecified abnormal findings in urine: Secondary | ICD-10-CM

## 2019-03-29 MED ORDER — CIPROFLOXACIN HCL 500 MG PO TABS: 500.0000 mg | ORAL_TABLET | Freq: Two times a day (BID) | ORAL | 0 refills | Status: DC

## 2019-03-29 NOTE — Telephone Encounter (Signed)
Schedule OV to discuss and get urine specimen

## 2019-03-29 NOTE — Patient Instructions (Signed)
Cipro 500 mg twice daily for 10 days.  Urine culture pending.  Referral to urologist for evaluation of recurrent urinary infections.

## 2019-03-29 NOTE — Telephone Encounter (Signed)
Schedule appointment?

## 2019-03-29 NOTE — Progress Notes (Signed)
   Subjective:    Patient ID: Susan Rivas, female    DOB: 06-08-1960, 58 y.o.   MRN: [id number]  HPI 58 year old Female in today with another urinary tract infection.  Has sexual intercourse 3-5 times a week.  Has noticed a relationship with onset of urinary tract infection symptoms with sexual intercourse.  Does urinate after intercourse.  Dr. Corinna Capra is GYN physician.  History of bilateral tubal ligation and history of LEEP procedure for abnormal Pap smear.  She called on Friday but the office was closed for Thanksgiving.  She then had a virtual visit was placed on Cipro 500 mg twice daily for 5 days.  She has now finished this course of medication but has had recurrent symptoms.  She called today for advice and office visit was advised.  Apparently got better but then had relapse during these past few days.  Last had urinary tract infection symptoms in October 2020 but culture grew mixed flora suggesting contamination.  Urine culture in January 2020 no growth but patient was symptomatic.  Was treated with antibiotics.  Urine showed moderate blood on urine dipstick.  Review of Systems does not take estrogen replacement     Objective:   Physical Exam  She is afebrile.  No CVA tenderness.  Urine shows trace LE and moderate occult blood on dipstick.  Sent for urine microscopic and culture.      Assessment & Plan:  Recurrent urinary tract infections  Plan: Refill Cipro 500 mg twice daily for 10 days pending culture result.  Since she continues to have recurrences despite urination after intercourse, we will refer to urologist for evaluation.

## 2019-03-29 NOTE — Telephone Encounter (Signed)
Susan Rivas [phone]  Laelle called to say over the Thanksgiving Holiday she was having UTI symptoms and contacted the [facility] virtual visit and they called her in 5 days of Cipro, she would like to get 5 more days of Cipro because of her history. She is having no symptoms at this time, she has not completed the Cipro yet. I let her know we may need virtual visit or telephone visit to discuss.

## 2019-03-30 ENCOUNTER — Ambulatory Visit: Payer: 59 | Admitting: Internal Medicine

## 2019-03-30 LAB — POCT URINALYSIS DIPSTICK
Appearance: NEGATIVE
Bilirubin, UA: NEGATIVE
Glucose, UA: NEGATIVE
Ketones, UA: NEGATIVE
Nitrite, UA: NEGATIVE
Odor: NEGATIVE
Protein, UA: NEGATIVE
Spec Grav, UA: 1.015 (ref 1.010–1.025)
Urobilinogen, UA: 0.2 E.U./dL
pH, UA: 6.5 (ref 5.0–8.0)

## 2019-03-30 LAB — URINE CULTURE
MICRO NUMBER:: 1160195
SPECIMEN QUALITY:: ADEQUATE

## 2019-03-30 LAB — URINALYSIS, MICROSCOPIC ONLY
Bacteria, UA: NONE SEEN /HPF
Hyaline Cast: NONE SEEN /LPF
RBC / HPF: NONE SEEN /HPF (ref 0–2)
Squamous Epithelial / HPF: NONE SEEN /HPF (ref <=5)

## 2019-05-15 ENCOUNTER — Other Ambulatory Visit: Payer: 59 | Admitting: Internal Medicine

## 2019-05-18 ENCOUNTER — Other Ambulatory Visit: Payer: Self-pay

## 2019-05-18 ENCOUNTER — Other Ambulatory Visit: Payer: 59 | Admitting: Internal Medicine

## 2019-05-18 DIAGNOSIS — E7800 Pure hypercholesterolemia, unspecified: Secondary | ICD-10-CM

## 2019-05-18 LAB — HEPATIC FUNCTION PANEL
AG Ratio: 1.6 (calc) (ref 1.0–2.5)
ALT: 10 U/L (ref 6–29)
AST: 17 U/L (ref 10–35)
Albumin: 4.3 g/dL (ref 3.6–5.1)
Alkaline phosphatase (APISO): 82 U/L (ref 37–153)
Bilirubin, Direct: 0.1 mg/dL (ref 0.0–0.2)
Globulin: 2.7 g/dL (calc) (ref 1.9–3.7)
Indirect Bilirubin: 0.3 mg/dL (calc) (ref 0.2–1.2)
Total Bilirubin: 0.4 mg/dL (ref 0.2–1.2)
Total Protein: 7 g/dL (ref 6.1–8.1)

## 2019-05-18 LAB — LIPID PANEL
Cholesterol: 227 mg/dL — ABNORMAL HIGH (ref <200)
HDL: 68 mg/dL (ref >=50)
LDL Cholesterol (Calc): 140 mg/dL (calc) — ABNORMAL HIGH
Non-HDL Cholesterol (Calc): 159 mg/dL (calc) — ABNORMAL HIGH (ref <130)
Total CHOL/HDL Ratio: 3.3 (calc) (ref <5.0)
Triglycerides: 91 mg/dL (ref <150)

## 2019-07-06 ENCOUNTER — Telehealth: Payer: Self-pay | Admitting: Internal Medicine

## 2019-07-06 MED ORDER — ROSUVASTATIN CALCIUM 5 MG PO TABS: ORAL_TABLET | ORAL | 2 refills | Status: DC

## 2019-07-06 NOTE — Telephone Encounter (Signed)
Received Fax RX request from  Pharmacy [id number] - [place], [place] - [address] AT [facility] [address] & [facility] [address] Phone:  [phone]  Fax:  [phone]       Medication - rosuvastatin (CRESTOR) 5 MG tablet   Last Refill - 05/31/19  Last OV - 03/29/19  Last CPE - 01/31/19  Next Appointment -

## 2019-10-30 ENCOUNTER — Other Ambulatory Visit: Payer: Self-pay | Admitting: Internal Medicine

## 2019-10-30 DIAGNOSIS — Z1231 Encounter for screening mammogram for malignant neoplasm of breast: Secondary | ICD-10-CM

## 2019-11-21 ENCOUNTER — Ambulatory Visit
Admission: RE | Admit: 2019-11-21 | Discharge: 2019-11-21 | Disposition: A | Discharge status: Home / Self Care | Payer: 59 | Source: Ambulatory Visit | Attending: Internal Medicine | Admitting: Internal Medicine

## 2019-11-21 DIAGNOSIS — Z1231 Encounter for screening mammogram for malignant neoplasm of breast: Secondary | ICD-10-CM

## 2020-03-10 ENCOUNTER — Other Ambulatory Visit: Payer: Self-pay | Admitting: Internal Medicine

## 2020-03-10 MED ORDER — ROSUVASTATIN CALCIUM 5 MG PO TABS: ORAL_TABLET | ORAL | 0 refills | Status: DC

## 2020-03-10 NOTE — Telephone Encounter (Signed)
She is past due for health maintenance exam. Must book CPE with labs before we can refill.

## 2020-03-10 NOTE — Telephone Encounter (Signed)
Susan Rivas [phone]  [name] called to say she is changing pharmacy's and would like her prescription below sent to new mail order pharmacy [facility].  rosuvastatin (CRESTOR) 5 MG tablet

## 2020-03-10 NOTE — Telephone Encounter (Signed)
Scheduled

## 2020-03-10 NOTE — Telephone Encounter (Signed)
Refill med as requested until appt

## 2020-04-25 ENCOUNTER — Other Ambulatory Visit: Payer: Self-pay

## 2020-04-25 MED ORDER — CYCLOBENZAPRINE HCL 10 MG PO TABS: 10.0000 mg | ORAL_TABLET | Freq: Three times a day (TID) | ORAL | 0 refills | Status: AC | PRN

## 2020-04-25 MED ORDER — ROSUVASTATIN CALCIUM 5 MG PO TABS: ORAL_TABLET | ORAL | 0 refills | Status: DC

## 2020-06-02 ENCOUNTER — Other Ambulatory Visit: Payer: Self-pay

## 2020-06-02 ENCOUNTER — Other Ambulatory Visit: Payer: 59 | Admitting: Internal Medicine

## 2020-06-02 DIAGNOSIS — E7800 Pure hypercholesterolemia, unspecified: Secondary | ICD-10-CM

## 2020-06-02 DIAGNOSIS — Z0000 Encounter for general adult medical examination without abnormal findings: Secondary | ICD-10-CM

## 2020-06-02 DIAGNOSIS — Z8669 Personal history of other diseases of the nervous system and sense organs: Secondary | ICD-10-CM

## 2020-06-02 DIAGNOSIS — Z1329 Encounter for screening for other suspected endocrine disorder: Secondary | ICD-10-CM

## 2020-06-03 ENCOUNTER — Ambulatory Visit (INDEPENDENT_AMBULATORY_CARE_PROVIDER_SITE_OTHER): Payer: 59 | Admitting: Internal Medicine

## 2020-06-03 ENCOUNTER — Encounter: Payer: Self-pay | Admitting: Internal Medicine

## 2020-06-03 VITALS — BP 110/70 | Ht 64.0 in | Wt 140.0 lb

## 2020-06-03 DIAGNOSIS — Z0000 Encounter for general adult medical examination without abnormal findings: Secondary | ICD-10-CM | POA: Diagnosis not present

## 2020-06-03 DIAGNOSIS — Z8669 Personal history of other diseases of the nervous system and sense organs: Secondary | ICD-10-CM | POA: Diagnosis not present

## 2020-06-03 DIAGNOSIS — E7800 Pure hypercholesterolemia, unspecified: Secondary | ICD-10-CM | POA: Diagnosis not present

## 2020-06-03 LAB — TSH: TSH: 1.12 mIU/L (ref 0.40–4.50)

## 2020-06-03 LAB — CBC WITH DIFFERENTIAL/PLATELET
Absolute Monocytes: 356 cells/uL (ref 200–950)
Basophils Absolute: 18 cells/uL (ref 0–200)
Basophils Relative: 0.4 %
Eosinophils Absolute: 41 cells/uL (ref 15–500)
Eosinophils Relative: 0.9 %
HCT: 41.2 % (ref 35.0–45.0)
Hemoglobin: 13.5 g/dL (ref 11.7–15.5)
Lymphs Abs: 1575 cells/uL (ref 850–3900)
MCH: 29.6 pg (ref 27.0–33.0)
MCHC: 32.8 g/dL (ref 32.0–36.0)
MCV: 90.4 fL (ref 80.0–100.0)
MPV: 9.6 fL (ref 7.5–12.5)
Monocytes Relative: 7.9 %
Neutro Abs: 2511 cells/uL (ref 1500–7800)
Neutrophils Relative %: 55.8 %
Platelets: 306 10*3/uL (ref 140–400)
RBC: 4.56 10*6/uL (ref 3.80–5.10)
RDW: 12.3 % (ref 11.0–15.0)
Total Lymphocyte: 35 %
WBC: 4.5 10*3/uL (ref 3.8–10.8)

## 2020-06-03 LAB — LIPID PANEL
Cholesterol: 221 mg/dL — ABNORMAL HIGH (ref <200)
HDL: 65 mg/dL (ref >=50)
LDL Cholesterol (Calc): 133 mg/dL (calc) — ABNORMAL HIGH
Non-HDL Cholesterol (Calc): 156 mg/dL (calc) — ABNORMAL HIGH (ref <130)
Total CHOL/HDL Ratio: 3.4 (calc) (ref <5.0)
Triglycerides: 123 mg/dL (ref <150)

## 2020-06-03 LAB — COMPLETE METABOLIC PANEL WITH GFR
AG Ratio: 1.6 (calc) (ref 1.0–2.5)
ALT: 14 U/L (ref 6–29)
AST: 17 U/L (ref 10–35)
Albumin: 4.4 g/dL (ref 3.6–5.1)
Alkaline phosphatase (APISO): 85 U/L (ref 37–153)
BUN: 14 mg/dL (ref 7–25)
CO2: 31 mmol/L (ref 20–32)
Calcium: 9.7 mg/dL (ref 8.6–10.4)
Chloride: 102 mmol/L (ref 98–110)
Creat: 0.67 mg/dL (ref 0.50–1.05)
GFR, Est African American: 113 mL/min/{1.73_m2} (ref >=60)
GFR, Est Non African American: 98 mL/min/{1.73_m2} (ref >=60)
Globulin: 2.7 g/dL (calc) (ref 1.9–3.7)
Glucose, Bld: 88 mg/dL (ref 65–99)
Potassium: 4.5 mmol/L (ref 3.5–5.3)
Sodium: 140 mmol/L (ref 135–146)
Total Bilirubin: 0.4 mg/dL (ref 0.2–1.2)
Total Protein: 7.1 g/dL (ref 6.1–8.1)

## 2020-06-03 LAB — POCT URINALYSIS DIPSTICK
Appearance: NEGATIVE
Bilirubin, UA: NEGATIVE
Blood, UA: NEGATIVE
Glucose, UA: NEGATIVE
Ketones, UA: NEGATIVE
Leukocytes, UA: NEGATIVE
Nitrite, UA: NEGATIVE
Odor: NEGATIVE
Protein, UA: NEGATIVE
Spec Grav, UA: 1.01 (ref 1.010–1.025)
Urobilinogen, UA: 0.2 E.U./dL
pH, UA: 6.5 (ref 5.0–8.0)

## 2020-06-03 MED ORDER — ROSUVASTATIN CALCIUM 5 MG PO TABS: 5.0000 mg | ORAL_TABLET | Freq: Every day | ORAL | 3 refills | Status: DC

## 2020-06-03 NOTE — Progress Notes (Signed)
   Subjective:    Patient ID: Susan Rivas, female    DOB: [birth year], 60 y.o.   MRN: [id number]  HPI  60 year old Female for health maintenance exam and evaluation of medical issues. Has had 2 Covid vaccines and does not want third one. Did have flu vaccine in 2021 through employment.  Hx of migraine headaches and has seen Dr. Lucia Gaskins in July 2020 and evaluation included MRI of the brain which was normal.  Was advised to use Maxalt for acute management of migraine headaches.Marland Kitchen    Hx of pure hypercholesterolemia treated with low dose generic Crestor 3 times a week.  Past medical history: LEEP procedure for abnormal Pap smear 1992.  Arthroscopy right shoulder 1990.  Right shoulder reconstruction 1991.  Bilateral tubal ligation in 2005.  Intolerant of codeine and hydrocodone.  Dr. Rana Snare is GYN physician.  One episode of vertigo October 2012 requiring emergency department visit.  Had colonoscopy 2016 with 10-year follow-up recommended.  Had mammogram July 2021 which was normal.  Social history: She is married to a Company secretary.  2 children, son and a daughter.  Does not smoke.  Social alcohol consumption.  She is a Engineer, civil (consulting) and former paramedic.  Family history: Father died of natural causes.  1 sister in good health.  Mother with history of hyperlipidemia.    Review of Systems  Constitutional: Negative.   Respiratory: Negative.   Cardiovascular: Negative.   Gastrointestinal: Negative.   Genitourinary: Negative.   Neurological:       History of migraine headaches  Psychiatric/Behavioral: Negative.        Objective:   Physical Exam Blood pressure 110/70 weight 140 pounds height 5 feet 4 inches BMI 24 point  Skin: Warm and dry.  No cervical adenopathy.  TMs clear.  Neck: Without thyromegaly or carotid bruits.  No cervical adenopathy.  Breast: Without masses chest is clear to auscultation.  Cardiac exam: Regular rate and rhythm normal S1 and S2 without murmurs.  Abdomen is soft  nondistended without hepatosplenomegaly masses or tenderness.  GYN exam deferred to Dr. Laury Axon.  No lower extremity edema.  No deformities of the extremities.  Neuro: Intact without focal deficits.  Affect thought and judgment are normal.       Assessment & Plan:  Pure hypercholesterolemia total cholesterol 221, HDL 65, triglycerides 123 and LDL elevated at 133.  This is similar to last year.  Instead of taking Crestor 5 mg 3 times a week I have suggested she take Crestor 5 mg daily and follow-up in early August with lipid panel liver functions without office visit.  This may be easier to remember and be more compliant with medication.  Migraine headaches treated with as needed Maxalt  Plan: See above regarding follow-up on hyperlipidemia with lab only visit in August.

## 2020-06-03 NOTE — Patient Instructions (Addendum)
It was a pleasure to see you today. Increase Crestor to 5 mg daily. RTC in 6 months for labs only- lipid and liver panel on daily instead of 3 times a week Crestor.

## 2020-07-06 ENCOUNTER — Encounter: Payer: Self-pay | Admitting: Internal Medicine

## 2020-12-01 ENCOUNTER — Other Ambulatory Visit: Payer: 59 | Admitting: Internal Medicine

## 2020-12-01 DIAGNOSIS — E7800 Pure hypercholesterolemia, unspecified: Secondary | ICD-10-CM

## 2020-12-15 ENCOUNTER — Other Ambulatory Visit: Payer: Managed Care, Other (non HMO) | Admitting: Internal Medicine

## 2020-12-15 ENCOUNTER — Other Ambulatory Visit: Payer: Self-pay

## 2020-12-15 DIAGNOSIS — E7800 Pure hypercholesterolemia, unspecified: Secondary | ICD-10-CM

## 2020-12-16 LAB — LIPID PANEL
Cholesterol: 213 mg/dL — ABNORMAL HIGH (ref <200)
HDL: 71 mg/dL (ref >=50)
LDL Cholesterol (Calc): 117 mg/dL (calc) — ABNORMAL HIGH
Non-HDL Cholesterol (Calc): 142 mg/dL (calc) — ABNORMAL HIGH (ref <130)
Total CHOL/HDL Ratio: 3 (calc) (ref <5.0)
Triglycerides: 130 mg/dL (ref <150)

## 2020-12-16 LAB — HEPATIC FUNCTION PANEL
AG Ratio: 2 (calc) (ref 1.0–2.5)
ALT: 11 U/L (ref 6–29)
AST: 13 U/L (ref 10–35)
Albumin: 4.7 g/dL (ref 3.6–5.1)
Alkaline phosphatase (APISO): 81 U/L (ref 37–153)
Bilirubin, Direct: 0.1 mg/dL (ref 0.0–0.2)
Globulin: 2.4 g/dL (calc) (ref 1.9–3.7)
Indirect Bilirubin: 0.3 mg/dL (calc) (ref 0.2–1.2)
Total Bilirubin: 0.4 mg/dL (ref 0.2–1.2)
Total Protein: 7.1 g/dL (ref 6.1–8.1)

## 2020-12-22 ENCOUNTER — Telehealth: Payer: Self-pay

## 2020-12-25 NOTE — Telephone Encounter (Signed)
err

## 2021-01-22 ENCOUNTER — Other Ambulatory Visit: Payer: Self-pay | Admitting: Internal Medicine

## 2021-01-22 DIAGNOSIS — Z1231 Encounter for screening mammogram for malignant neoplasm of breast: Secondary | ICD-10-CM

## 2021-01-23 ENCOUNTER — Other Ambulatory Visit: Payer: Self-pay

## 2021-01-23 ENCOUNTER — Ambulatory Visit
Admission: RE | Admit: 2021-01-23 | Discharge: 2021-01-23 | Disposition: A | Discharge status: Home / Self Care | Payer: Managed Care, Other (non HMO) | Source: Ambulatory Visit | Attending: Internal Medicine | Admitting: Internal Medicine

## 2021-01-23 DIAGNOSIS — Z1231 Encounter for screening mammogram for malignant neoplasm of breast: Secondary | ICD-10-CM

## 2021-03-19 ENCOUNTER — Other Ambulatory Visit: Payer: Self-pay | Admitting: Internal Medicine

## 2021-05-07 ENCOUNTER — Other Ambulatory Visit: Payer: Self-pay

## 2021-05-07 ENCOUNTER — Other Ambulatory Visit: Payer: Managed Care, Other (non HMO) | Admitting: Internal Medicine

## 2021-05-07 DIAGNOSIS — E7800 Pure hypercholesterolemia, unspecified: Secondary | ICD-10-CM

## 2021-05-07 DIAGNOSIS — F32A Depression, unspecified: Secondary | ICD-10-CM

## 2021-05-07 DIAGNOSIS — Z1329 Encounter for screening for other suspected endocrine disorder: Secondary | ICD-10-CM

## 2021-05-07 LAB — LIPID PANEL
Cholesterol: 214 mg/dL — ABNORMAL HIGH (ref <200)
HDL: 72 mg/dL (ref >=50)
LDL Cholesterol (Calc): 119 mg/dL (calc) — ABNORMAL HIGH
Non-HDL Cholesterol (Calc): 142 mg/dL (calc) — ABNORMAL HIGH (ref <130)
Total CHOL/HDL Ratio: 3 (calc) (ref <5.0)
Triglycerides: 119 mg/dL (ref <150)

## 2021-05-07 LAB — COMPLETE METABOLIC PANEL WITH GFR
AG Ratio: 1.6 (calc) (ref 1.0–2.5)
ALT: 10 U/L (ref 6–29)
AST: 15 U/L (ref 10–35)
Albumin: 4.4 g/dL (ref 3.6–5.1)
Alkaline phosphatase (APISO): 80 U/L (ref 37–153)
BUN: 18 mg/dL (ref 7–25)
CO2: 30 mmol/L (ref 20–32)
Calcium: 9.1 mg/dL (ref 8.6–10.4)
Chloride: 104 mmol/L (ref 98–110)
Creat: 0.66 mg/dL (ref 0.50–1.03)
Globulin: 2.7 g/dL (calc) (ref 1.9–3.7)
Glucose, Bld: 83 mg/dL (ref 65–99)
Potassium: 4.3 mmol/L (ref 3.5–5.3)
Sodium: 140 mmol/L (ref 135–146)
Total Bilirubin: 0.4 mg/dL (ref 0.2–1.2)
Total Protein: 7.1 g/dL (ref 6.1–8.1)
eGFR: 102 mL/min/{1.73_m2} (ref >=60)

## 2021-05-07 LAB — CBC WITH DIFFERENTIAL/PLATELET
Absolute Monocytes: 276 cells/uL (ref 200–950)
Basophils Absolute: 20 cells/uL (ref 0–200)
Basophils Relative: 0.5 %
Eosinophils Absolute: 28 cells/uL (ref 15–500)
Eosinophils Relative: 0.7 %
HCT: 40.2 % (ref 35.0–45.0)
Hemoglobin: 13.1 g/dL (ref 11.7–15.5)
Lymphs Abs: 1520 cells/uL (ref 850–3900)
MCH: 29.2 pg (ref 27.0–33.0)
MCHC: 32.6 g/dL (ref 32.0–36.0)
MCV: 89.7 fL (ref 80.0–100.0)
MPV: 9.6 fL (ref 7.5–12.5)
Monocytes Relative: 6.9 %
Neutro Abs: 2156 cells/uL (ref 1500–7800)
Neutrophils Relative %: 53.9 %
Platelets: 282 10*3/uL (ref 140–400)
RBC: 4.48 10*6/uL (ref 3.80–5.10)
RDW: 12.3 % (ref 11.0–15.0)
Total Lymphocyte: 38 %
WBC: 4 10*3/uL (ref 3.8–10.8)

## 2021-05-07 LAB — TSH: TSH: 1.63 mIU/L (ref 0.40–4.50)

## 2021-05-14 ENCOUNTER — Other Ambulatory Visit: Payer: Self-pay

## 2021-05-14 ENCOUNTER — Ambulatory Visit (INDEPENDENT_AMBULATORY_CARE_PROVIDER_SITE_OTHER): Payer: Managed Care, Other (non HMO) | Admitting: Internal Medicine

## 2021-05-14 ENCOUNTER — Encounter: Payer: Self-pay | Admitting: Internal Medicine

## 2021-05-14 VITALS — BP 116/80 | HR 82 | Temp 98.2°F | Ht 63.25 in | Wt 151.2 lb

## 2021-05-14 DIAGNOSIS — E7800 Pure hypercholesterolemia, unspecified: Secondary | ICD-10-CM | POA: Diagnosis not present

## 2021-05-14 DIAGNOSIS — Z23 Encounter for immunization: Secondary | ICD-10-CM | POA: Diagnosis not present

## 2021-05-14 DIAGNOSIS — Z0000 Encounter for general adult medical examination without abnormal findings: Secondary | ICD-10-CM

## 2021-05-14 DIAGNOSIS — Z8669 Personal history of other diseases of the nervous system and sense organs: Secondary | ICD-10-CM

## 2021-05-14 NOTE — Progress Notes (Signed)
Subjective:    Patient ID: Susan Rivas, female    DOB: [birth year], 61 y.o.   MRN: [id number]  HPI 61 year old Female seen for health maintenance exam and evaluation of medical issues.  In October fell on slick floor at [employer].  She fell onto her left wrist.  Seen at [facility] on [address] the following day.  X-ray was unremarkable except for a remote fracture of the tip of the ulnar styloid.  And degenerative changes of the first [facility] joint.  It took some time but patient has recovered from this injury.  She saw Dr. Merlyn Lot and had arthrogram of the left wrist.  Was treated with ibuprofen and then Mobic.  Pain initially was over middle and lower one third of radius but subsequently moved to head of radius and had pain with thumb ROM.  She wore splint for possible scaphoid fracture.  Patient was felt to have arthritis it [facility] joint and no fracture.  Sees Dr. Rana Snare for GYN exam.  Tetanus update given today.  History of migraine headaches and saw Dr. Lucia Gaskins in July 2020.  MRI of the brain was normal.  Was advised to use Maxalt for acute management.  History of pure hypercholesterolemia treated with low-dose Crestor 3 times a week.  Have not seen any change in her lipid panel and she will increase this to daily  Past medical history: LEEP procedure for abnormal Pap smear 1992.  Arthroscopy right shoulder 1990.  Right shoulder reconstruction 1991.  Bilateral tubal ligation in 2005.  Intolerant of codeine and hydrocodone.  1 episode of vertigo October 2012 requiring emergency department visit.  Had colonoscopy 2016 with 10-year follow-up recommended.  Had C-sections and 2004 and in 2005.  Sees Dr. Rana Snare for GYN exam.  History of anxiety and depression treated with Zoloft.    Social history: She is a Engineer, civil (consulting) and former paramedic.  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  Does not smoke.  Social alcohol consumption.  2 children, son and a daughter.  Husband is a  Company secretary.  Father died of natural causes.  1 sister in good health.  Mother deceased with heart failure.  Labs reviewed total cholesterol 214 and was 213 in August 2022.  LDL cholesterol 117 in August 2022 and is now 119.  Triglycerides normal at 119 and HDL good at 72.  Crestor 3 times a week has not made a difference.  She may have missed some doses.  She will make this daily now going forward and return for follow-up in 6 months  Review of Systems no new complaints     Objective:   Physical Exam Vital signs reviewed.  BMI 26.58 blood pressure 116/80  Skin is warm and dry.  No cervical adenopathy.  No carotid bruits.  No thyromegaly.  Chest is clear to auscultation.  Cardiac exam: Regular rate and rhythm without ectopy or murmur.  Abdomen is soft nondistended without hepatosplenomegaly masses or tenderness.  No lower extremity edema.  Neuro is intact without gross focal deficits.  Affect thought and judgment are normal.       Assessment & Plan:  Pure hypercholesterolemia-increase Crestor to 5 mg daily and follow-up in 6 months.  Also discussed with her possibility of coronary calcium score via CT screening.  Need for Tdap vaccine given today  History of migraine headaches seen by Dr. Lucia Gaskins treated with Maxalt  Wrist injury due to a fall-improved  Plan: Return in 6  months for fasting lipid panel and follow-up.  Continue to see Dr. Rana Snare for GYN care.  Had colonoscopy in 2016.  Continue with annual mammogram.

## 2021-05-24 NOTE — Patient Instructions (Addendum)
It was a pleasure to see you today.  Take Crestor 5 mg daily and follow-up in 6 months.  Tetanus update given today

## 2021-07-07 ENCOUNTER — Other Ambulatory Visit: Payer: Self-pay

## 2021-07-07 ENCOUNTER — Telehealth: Payer: Self-pay | Admitting: Internal Medicine

## 2021-07-07 ENCOUNTER — Encounter: Payer: Self-pay | Admitting: Internal Medicine

## 2021-07-07 ENCOUNTER — Ambulatory Visit: Payer: Managed Care, Other (non HMO) | Admitting: Internal Medicine

## 2021-07-07 VITALS — BP 130/86 | HR 78 | Temp 98.8°F | Ht 63.25 in | Wt 145.5 lb

## 2021-07-07 DIAGNOSIS — R300 Dysuria: Secondary | ICD-10-CM

## 2021-07-07 DIAGNOSIS — N390 Urinary tract infection, site not specified: Secondary | ICD-10-CM

## 2021-07-07 DIAGNOSIS — R319 Hematuria, unspecified: Secondary | ICD-10-CM

## 2021-07-07 LAB — POCT URINALYSIS DIPSTICK
Bilirubin, UA: NEGATIVE
Glucose, UA: NEGATIVE
Ketones, UA: NEGATIVE
Nitrite, UA: NEGATIVE
Protein, UA: POSITIVE — AB
Spec Grav, UA: 1.025 (ref 1.010–1.025)
Urobilinogen, UA: 0.2 E.U./dL
pH, UA: 5 (ref 5.0–8.0)

## 2021-07-07 MED ORDER — PHENAZOPYRIDINE HCL 100 MG PO TABS: 100.0000 mg | ORAL_TABLET | Freq: Three times a day (TID) | ORAL | 0 refills | Status: DC | PRN

## 2021-07-07 MED ORDER — NITROFURANTOIN MONOHYD MACRO 100 MG PO CAPS: 100.0000 mg | ORAL_CAPSULE | Freq: Two times a day (BID) | ORAL | 0 refills | Status: DC

## 2021-07-07 MED ORDER — NITROFURANTOIN MACROCRYSTAL 100 MG PO CAPS: 100.0000 mg | ORAL_CAPSULE | Freq: Four times a day (QID) | ORAL | 0 refills | Status: DC

## 2021-07-07 NOTE — Patient Instructions (Addendum)
Start Macrodantin 100 mg twice daily for 7 days pending culture results.  Take Pyridium for dysuria. ?

## 2021-07-07 NOTE — Progress Notes (Signed)
? ?  Subjective:  ? ? Patient ID: Susan Rivas, female    DOB: 07/18/1960, 61 y.o.   MRN: [id number] ? ?HPI 61 year old Female with dysuria and urinary frequency onset last evening.  No fever or chills.  No nausea or vomiting.  Has noticed some hematuria and urine odor.  Has noticed urine is cloudy.  Has had bladder spasms.  Infrequently has UTIs. ? ?Had UTI in 2019 treated at [facility] with Cipro. ? ?Review of Systems see above ? ?   ?Objective:  ? Physical Exam ?Blood pressure 130/86, pulse 78, temperature 98.8 degrees, pulse oximetry 99% weight 145 pounds 8 ounces height 5 feet 3.25 inches BMI 25.57 ? ?No CVA tenderness.  Urine dipstick is abnormal containing 2+ LE and urine is positive for protein.  Culture is pending. ? ?   ?Assessment & Plan:  ?Acute urinary tract infection-has infrequent urinary infections ? ?Plan: Macrobid 100 mg twice daily for 7 days.  Pyridium 100 mg 3 times daily as needed for dysuria. ? ?

## 2021-07-07 NOTE — Telephone Encounter (Signed)
Susan Rivas ?[phone] ? ?Ivan called to say she has a UI or yeast infection, not sure which so I had her come on in at 10:45. ? ?

## 2021-07-10 LAB — URINE CULTURE
MICRO NUMBER:: 13128234
SPECIMEN QUALITY:: ADEQUATE

## 2021-08-27 ENCOUNTER — Telehealth: Payer: Self-pay | Admitting: Internal Medicine

## 2021-08-27 MED ORDER — ROSUVASTATIN CALCIUM 5 MG PO TABS: 5.0000 mg | ORAL_TABLET | Freq: Every day | ORAL | 3 refills | Status: DC

## 2021-08-27 NOTE — Telephone Encounter (Signed)
Jill Side Bowmer ?[phone] ? ?Giuliana called to say she needs below medication sent to new pharmacy. She stated it was new dosage you guys had discussed. She has changed insurances, this time she wants it to go to  ? ?[facility] [address] and [address] ? ?Next time it will go to new mail Order. ? ?rosuvastatin (CRESTOR) 10 MG tablet ?

## 2021-08-27 NOTE — Telephone Encounter (Signed)
Done

## 2021-10-29 ENCOUNTER — Telehealth: Payer: Self-pay | Admitting: Internal Medicine

## 2021-10-29 ENCOUNTER — Ambulatory Visit: Payer: BC Managed Care – PPO | Admitting: Internal Medicine

## 2021-10-29 ENCOUNTER — Encounter: Payer: Self-pay | Admitting: Internal Medicine

## 2021-10-29 VITALS — BP 120/84 | HR 85 | Temp 97.8°F | Wt 146.8 lb

## 2021-10-29 DIAGNOSIS — R21 Rash and other nonspecific skin eruption: Secondary | ICD-10-CM

## 2021-10-29 DIAGNOSIS — E7800 Pure hypercholesterolemia, unspecified: Secondary | ICD-10-CM

## 2021-10-29 DIAGNOSIS — L237 Allergic contact dermatitis due to plants, except food: Secondary | ICD-10-CM

## 2021-10-29 MED ORDER — METHYLPREDNISOLONE ACETATE 80 MG/ML IJ SUSP
80.0000 mg | Freq: Once | INTRAMUSCULAR | Status: AC
  Administered 2021-10-29: 80 mg via INTRAMUSCULAR

## 2021-10-29 MED ORDER — PREDNISONE 10 MG PO TABS: ORAL_TABLET | ORAL | 0 refills | Status: DC

## 2021-10-29 MED ORDER — ROSUVASTATIN CALCIUM 10 MG PO TABS: 10.0000 mg | ORAL_TABLET | Freq: Every day | ORAL | 3 refills | Status: DC

## 2021-10-29 NOTE — Patient Instructions (Addendum)
Depo-Medrol 80 mg IM given in office today.  Patient will take tapering course of prednisone 10 mg tablets starting with 6x 10 mg tablets on day 1 and decreasing by 1 tablet every 2 days i.e. 6-6-5-5-4-4-3-3-2-2-1-1 taper.  She declined offer for prescription for antihistamine such as Atarax.  May use calamine lotion on these lesions.  Crestor refilled as requested

## 2021-10-29 NOTE — Telephone Encounter (Signed)
Susan Rivas [phone]  Rash all over since the week end, scheduled for 9:30

## 2021-10-29 NOTE — Progress Notes (Signed)
   Subjective:    Patient ID: Susan Rivas, female    DOB: 03-29-1961, 61 y.o.   MRN: [id number]  HPI 61 year old Female seen for itchy rash onset Saturday after cleaning  out plant bed the previous day. Has erythematous maculopapular lesions some of which are vesicular but not draining over extremities and body.  Eyes are not involved.  Some of the lesions are linear consistent with poison ivy dermatitis.    Review of Systems no headache, sore throat, fever, chills, tick bite     Objective:   Physical Exam Blood pressure 120/84 pulse 85 temperature 97.8 degrees pulse oximetry 98% weight 146 pounds 12 ounces BMI 25.79  She is in no acute distress.  Reports rash is itchy and aggravating. Numerous erythematous maculopapular lesions some of which are vesicular but not draining over extremities and body.  Face/eyes not involved.  There is a linear distribution to some of these lesions which would lead to the diagnosis of poison ivy dermatitis.      Assessment & Plan:  Poison ivy dermatitis  Hypercholesterolemia-Crestor refilled as requested  Plan: Depo-Medrol 80 mg IM given in office.  Patient will take tapering course of prednisone 10 mg tablets starting with 6 tablets on day 1 and decreasing by 1 tablet every 2 days.  She declined offer for antihistamine tablets such as Atarax.  May use calamine lotion on these lesions.  Call if not better in 7 to 10 days or sooner if worse.  There is no evidence of secondary bacterial infection.

## 2021-11-05 ENCOUNTER — Other Ambulatory Visit: Payer: BC Managed Care – PPO

## 2021-11-05 DIAGNOSIS — E7800 Pure hypercholesterolemia, unspecified: Secondary | ICD-10-CM

## 2021-11-06 LAB — LIPID PANEL
Cholesterol: 234 mg/dL — ABNORMAL HIGH (ref <200)
HDL: 86 mg/dL (ref >=50)
LDL Cholesterol (Calc): 123 mg/dL (calc) — ABNORMAL HIGH
Non-HDL Cholesterol (Calc): 148 mg/dL (calc) — ABNORMAL HIGH (ref <130)
Total CHOL/HDL Ratio: 2.7 (calc) (ref <5.0)
Triglycerides: 133 mg/dL (ref <150)

## 2021-11-12 ENCOUNTER — Encounter: Payer: Self-pay | Admitting: Internal Medicine

## 2021-11-12 ENCOUNTER — Ambulatory Visit: Payer: BC Managed Care – PPO | Admitting: Internal Medicine

## 2021-11-12 VITALS — BP 112/82 | HR 78 | Temp 97.8°F | Ht 63.25 in | Wt 143.0 lb

## 2021-11-12 DIAGNOSIS — Z8669 Personal history of other diseases of the nervous system and sense organs: Secondary | ICD-10-CM | POA: Diagnosis not present

## 2021-11-12 DIAGNOSIS — E7800 Pure hypercholesterolemia, unspecified: Secondary | ICD-10-CM | POA: Diagnosis not present

## 2021-11-12 DIAGNOSIS — G43001 Migraine without aura, not intractable, with status migrainosus: Secondary | ICD-10-CM

## 2021-11-12 MED ORDER — HYDROCODONE-ACETAMINOPHEN 5-325 MG PO TABS: 1.0000 | ORAL_TABLET | Freq: Four times a day (QID) | ORAL | 0 refills | Status: DC | PRN

## 2021-11-12 NOTE — Progress Notes (Signed)
   Subjective:    Patient ID: Susan Rivas, female    DOB: 11/03/1960, 61 y.o.   MRN: [id number]  HPI 61 year old Female seen for follow-up on pure hypercholesterolemia.  She is on rosuvastatin 10 mg daily.  She had health maintenance exam here in January 2023.  She initially was started on low-dose Crestor 3 times a week but in January 2023, lipid panel had not shown significant improvement so she was started on daily Crestor.  However, total cholesterol is actually gone up from 2 14-2 34.  Her HDL has increased from 72-86.  Triglycerides are within normal limits and her LDL is about the same so I think the differences in HDL elevation for the most part which is a good thing.  She does not want to increase dose of lipid-lowering medication but I would like to see her LDL less than 100.  I do think she should think about coronary calcium scoring.  She does have a history of migraine headaches and has a migraine headache today.  Was given Norco 5/325  #15 tablets to take sparingly every 6 hours if needed for migraine.  Physical exam and fasting labs are due here January 2024.    Review of Systems no new complaints-Poison ivy has resolved     Objective:   Physical Exam Temperature 97.8 degrees pulse 78 regular blood pressure 112/82 pulse oximetry 97% weight 143 pounds BMI 25.13  Chest clear.  Cardiac exam: Regular rate and rhythm without ectopy.  No lower extremity edema.       Assessment & Plan:  Pure hypercholesterolemia-watch diet, get some exercise and continue to take statin medication.  Follow-up in 6 months.  Consider coronary calcium scoring.  Migraine headache-given 5-day quantity of hydrocodone APAP to take sparingly for migraine headache.  She has longstanding history of migraines. #15 tablets  Health maintenance exam due January 2024.  She needs to call and make an appointment for that.

## 2021-11-22 DIAGNOSIS — R300 Dysuria: Secondary | ICD-10-CM | POA: Diagnosis not present

## 2021-11-22 DIAGNOSIS — Z6823 Body mass index (BMI) 23.0-23.9, adult: Secondary | ICD-10-CM | POA: Diagnosis not present

## 2021-11-23 NOTE — Patient Instructions (Addendum)
She will continue with Crestor 5 mg daily and work on diet and exercise.  She will follow-up in January 2024.  She should consider coronary calcium scoring.  Health maintenance exam due January 2024.  Was given Norco 5/325  #15 tablets to take sparingly if needed for migraine headaches

## 2022-03-08 ENCOUNTER — Other Ambulatory Visit: Payer: Self-pay | Admitting: Obstetrics and Gynecology

## 2022-03-08 DIAGNOSIS — Z1231 Encounter for screening mammogram for malignant neoplasm of breast: Secondary | ICD-10-CM

## 2022-03-26 ENCOUNTER — Telehealth: Payer: Self-pay | Admitting: Internal Medicine

## 2022-03-26 NOTE — Telephone Encounter (Signed)
LVM to CB.

## 2022-03-26 NOTE — Telephone Encounter (Signed)
Patient called back and she is working and can not come up here, so she will go to Urgent Care over weekend

## 2022-03-26 NOTE — Telephone Encounter (Signed)
Susan Rivas [phone]  Evea called to see if you would call her in the Tamiflu since Cris and her son have both tested positive for the flu, she has now got a cough and a runny nose.

## 2022-03-29 ENCOUNTER — Telehealth: Payer: Self-pay | Admitting: Internal Medicine

## 2022-03-29 ENCOUNTER — Encounter: Payer: Self-pay | Admitting: Internal Medicine

## 2022-03-29 ENCOUNTER — Ambulatory Visit: Payer: BC Managed Care – PPO | Admitting: Internal Medicine

## 2022-03-29 VITALS — HR 99 | Temp 99.8°F

## 2022-03-29 DIAGNOSIS — J22 Unspecified acute lower respiratory infection: Secondary | ICD-10-CM

## 2022-03-29 DIAGNOSIS — R059 Cough, unspecified: Secondary | ICD-10-CM

## 2022-03-29 DIAGNOSIS — R52 Pain, unspecified: Secondary | ICD-10-CM | POA: Diagnosis not present

## 2022-03-29 DIAGNOSIS — R509 Fever, unspecified: Secondary | ICD-10-CM | POA: Diagnosis not present

## 2022-03-29 LAB — POCT INFLUENZA A/B
Influenza A, POC: NEGATIVE
Influenza B, POC: NEGATIVE

## 2022-03-29 MED ORDER — AZITHROMYCIN 250 MG PO TABS: ORAL_TABLET | ORAL | 0 refills | Status: AC

## 2022-03-29 NOTE — Telephone Encounter (Signed)
Susan Rivas  [phone]  Aneri called to say she has fever 99.5, cough, body aches, that she has had over the weekend. I have scheduled her for 11:30

## 2022-03-29 NOTE — Telephone Encounter (Signed)
scheduled

## 2022-03-29 NOTE — Progress Notes (Signed)
   Subjective:    Patient ID: Susan Rivas, female    DOB: 11/08/1960, 61 y.o.   MRN: [id number]  HPI 61 year old Female seen today for cough and congestion.  Son was recently diagnosed with influenza A.  Patient has had temp up to 99.5 degrees, cough, myalgias over the weekend.  Has not had recent COVID-vaccine or flu vaccine this season.  Rapid flu test today is negative.  Respiratory virus panel sent and subsequently proved to be negative for multiple viruses.  Review of Systems see above has malaise and fatigue as well as congestion.     Objective:   Physical Exam Patient looks fatigued and slightly pale.  Temperature 99.8 degrees.  Pulse 99 regular pulse oximetry 94%  Skin: Pale, warm and dry.  TMs clear.  Neck supple.  Chest is clear to auscultation but she has productive cough.     Assessment & Plan:   Acute lower respiratory infection-likely viral but developing secondary bacterial infection  Plan: Respiratory virus panel was sent but proved to be negative.  Rapid flu test was negative despite her son having had influenza recently.  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  Her services are needed urgently.  She was treated with Zithromax Z-PAK 2 tabs day 1 followed by 1 tab days 2 through 5.  Advised to rest and drink plenty of fluids.

## 2022-04-01 LAB — RESPIRATORY VIRUS PANEL

## 2022-04-04 NOTE — Patient Instructions (Addendum)
Take Zithromax Z-PAK 2 tabs day 1 followed by 1 tab days 2 through 5.  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  Rest and stay well-hydrated.  Addendum: Respiratory virus panel is negative

## 2022-04-05 ENCOUNTER — Ambulatory Visit: Payer: Self-pay

## 2022-04-21 ENCOUNTER — Ambulatory Visit: Payer: Self-pay

## 2022-04-25 IMAGING — MG MM DIGITAL SCREENING BILAT W/ TOMO AND CAD
6 of 10 series · 6 of 30 positions shown · non-contrast
Comparison: Previous exam(s).

CLINICAL DATA: Screening.

EXAM:
DIGITAL SCREENING BILATERAL MAMMOGRAM WITH TOMOSYNTHESIS AND CAD
TECHNIQUE: Bilateral screening digital craniocaudal and mediolateral oblique
mammograms were obtained. Bilateral screening digital breast
tomosynthesis was performed. The images were evaluated with
computer-aided detection.

[L MLO synth-2D (1 of 2)]
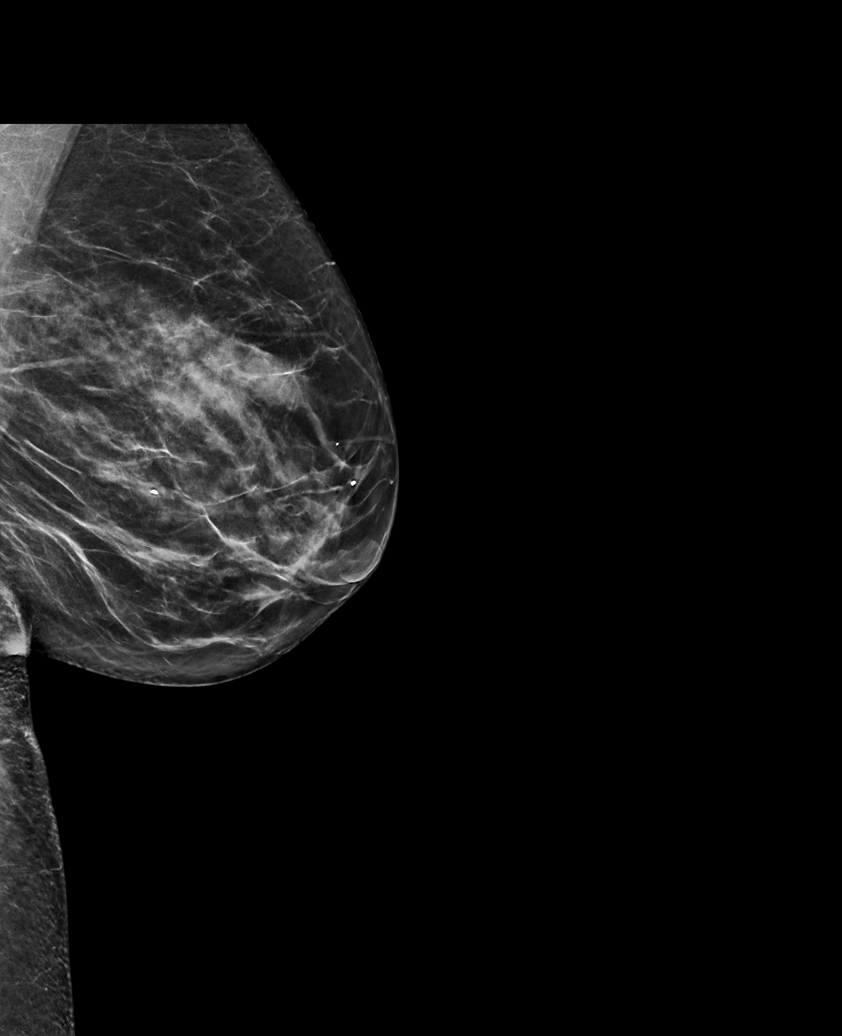

[L MLO synth-2D (2 of 2)]
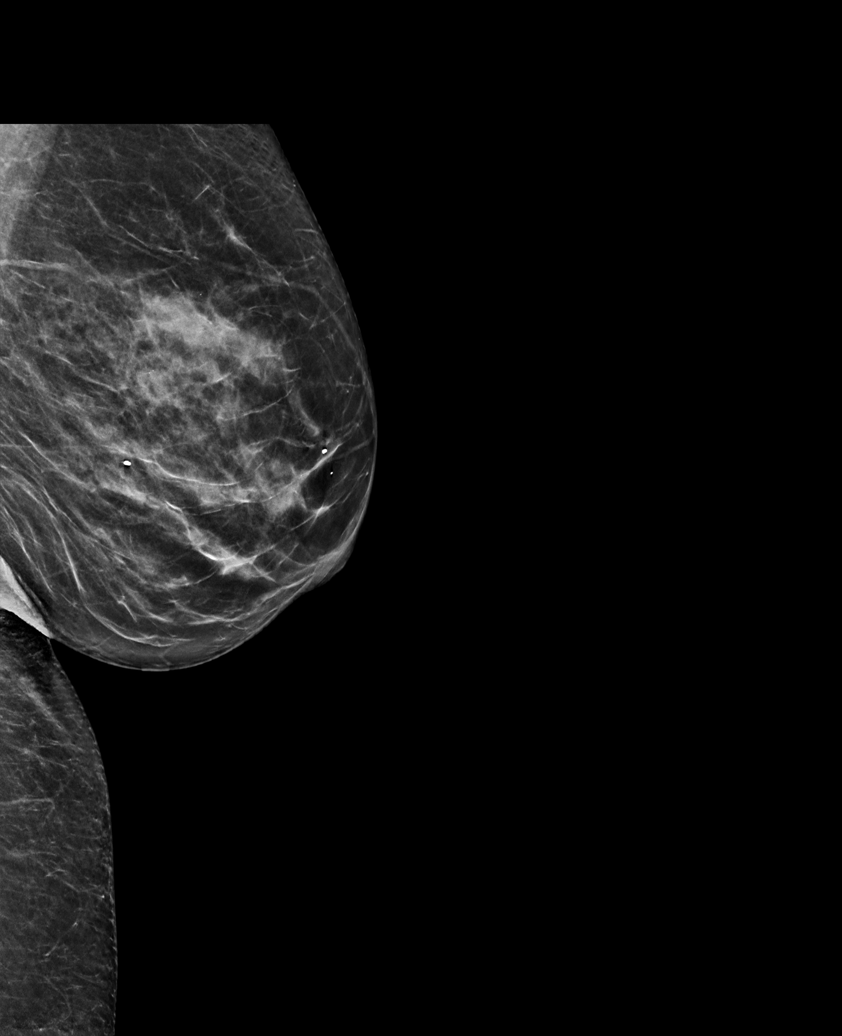

[R CC synth-2D]
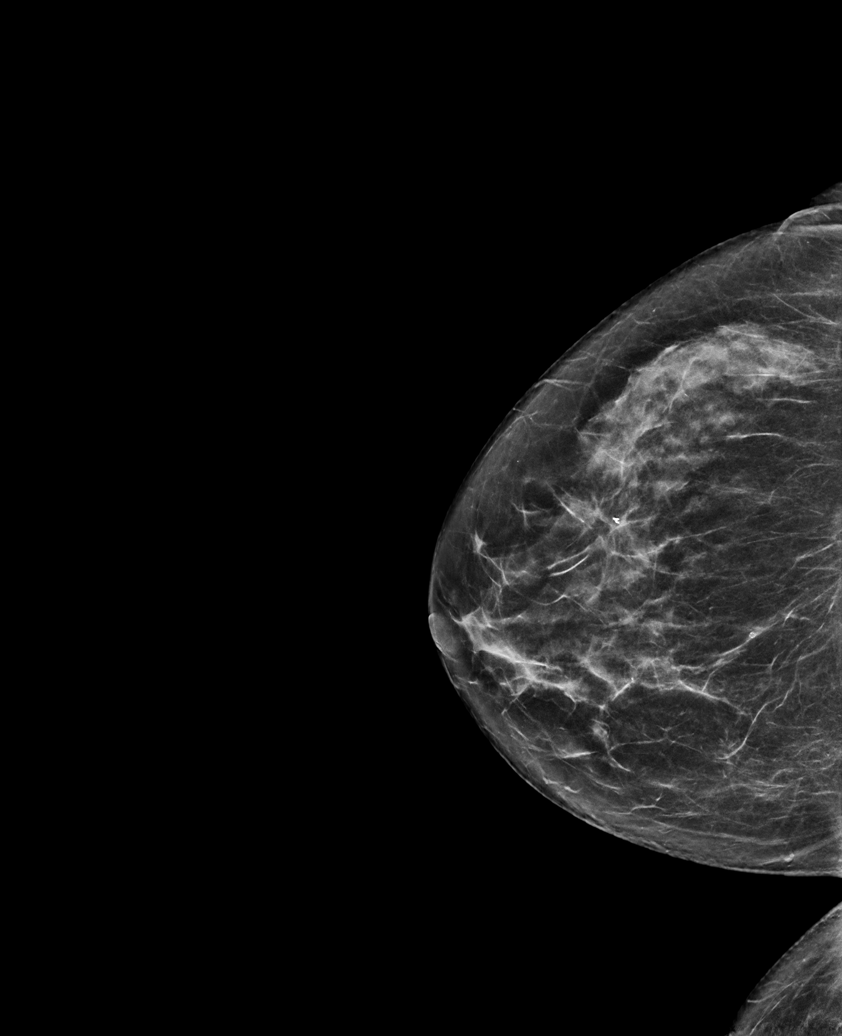

[L CC synth-2D]
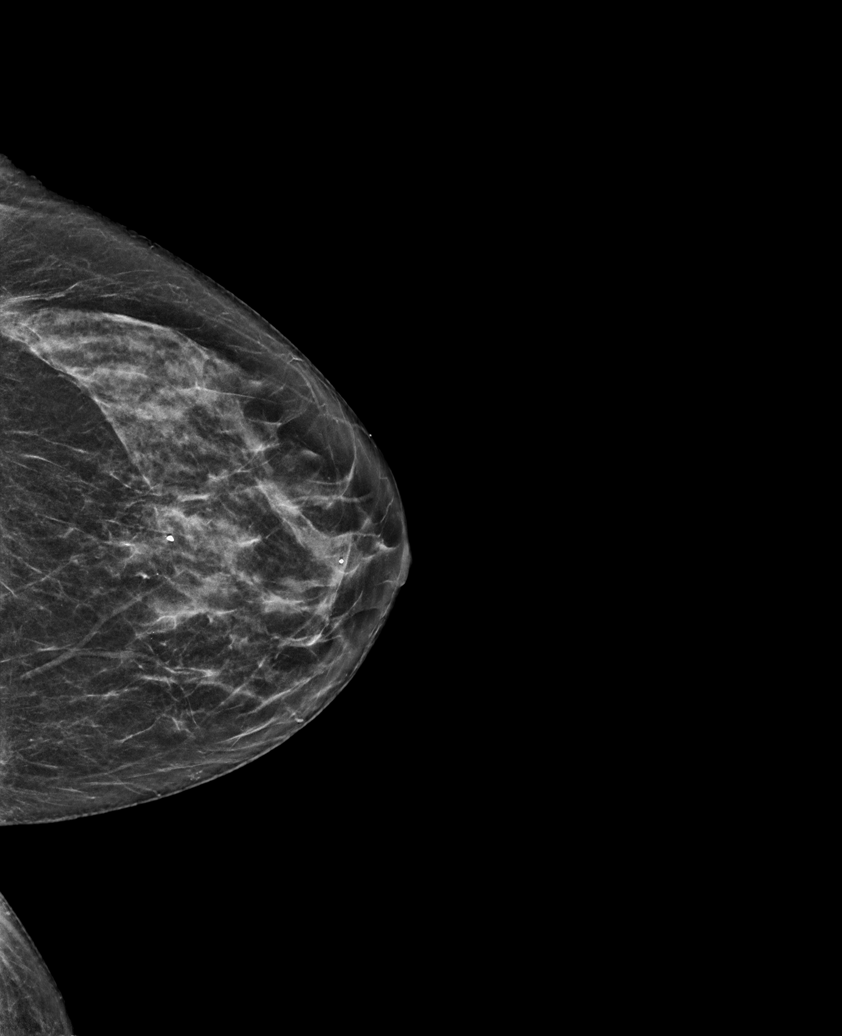

[R MLO synth-2D]
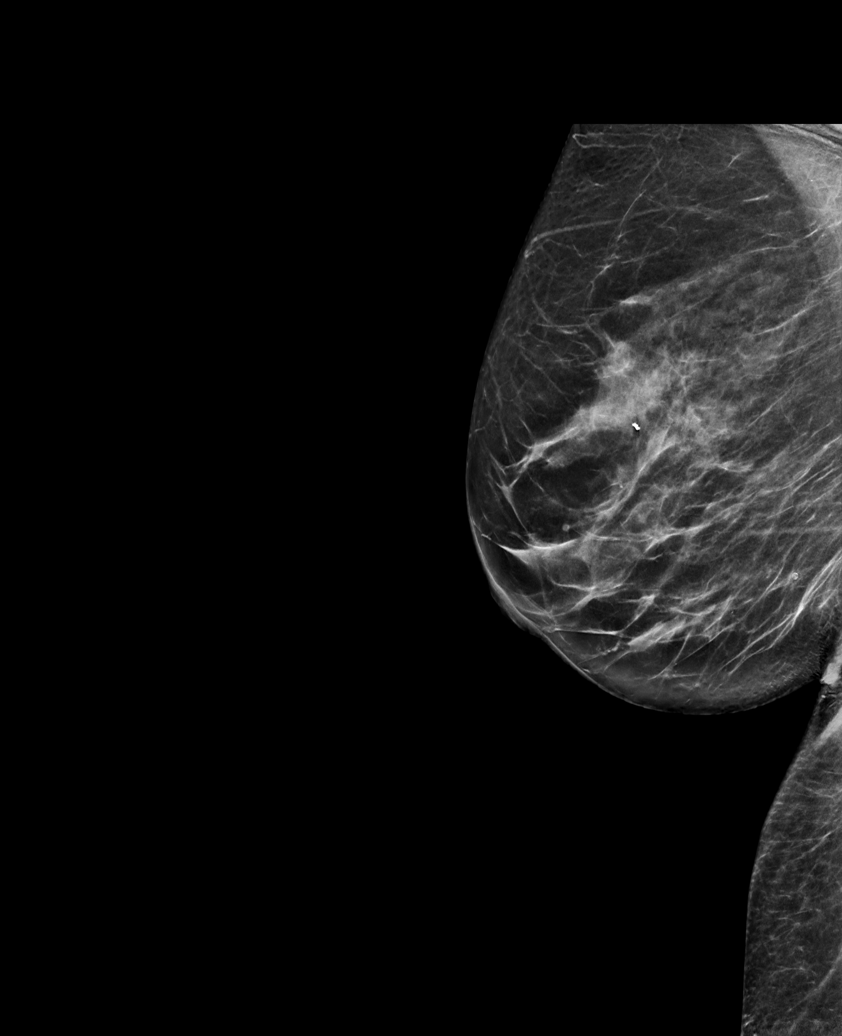

[L MLO tomo · tomo slice 31/61.0]
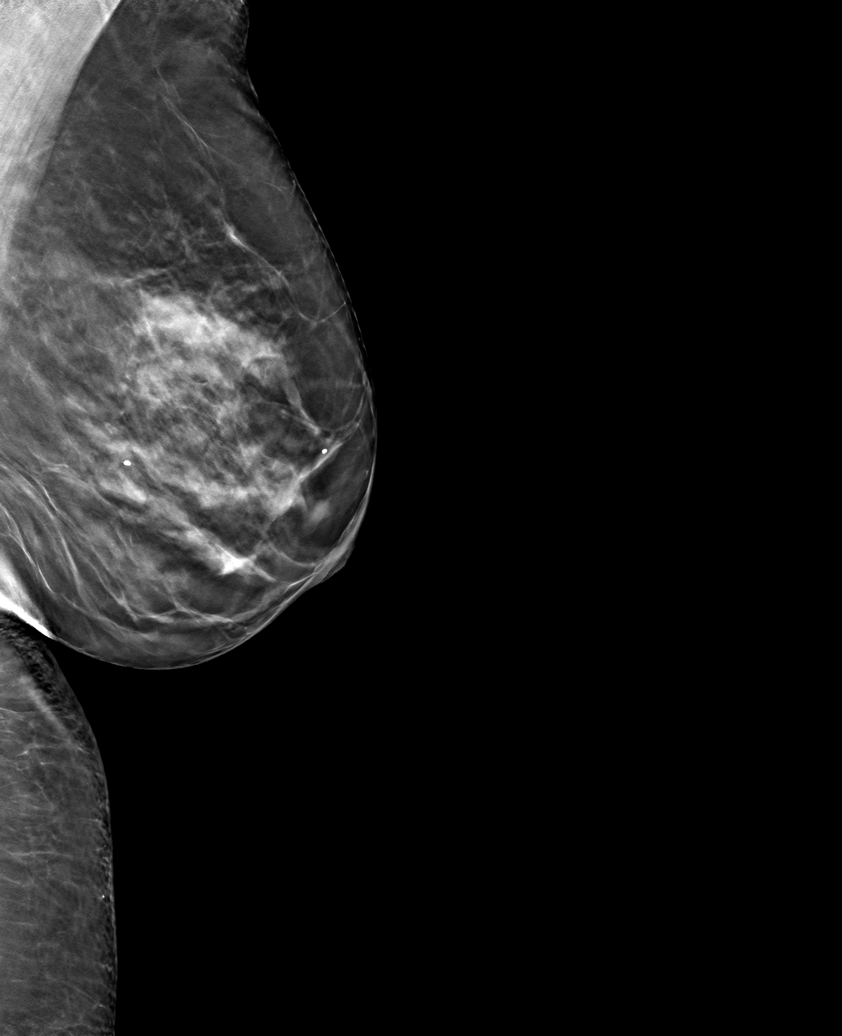

[6 of 30 positions shown; findings below may reference images not displayed]

ACR Breast Density Category c: The breast tissue is heterogeneously
dense, which may obscure small masses.
FINDINGS: There are no findings suspicious for malignancy.
IMPRESSION: No mammographic evidence of malignancy. A result letter of this
screening mammogram will be mailed directly to the patient.

RECOMMENDATION:
Screening mammogram in one year. (Code:Q3-W-BC3)

BI-RADS CATEGORY  1: Negative.

## 2022-05-31 ENCOUNTER — Ambulatory Visit
Admission: RE | Admit: 2022-05-31 | Discharge: 2022-05-31 | Disposition: A | Discharge status: Home / Self Care | Payer: BC Managed Care – PPO | Source: Ambulatory Visit | Attending: Obstetrics and Gynecology | Admitting: Obstetrics and Gynecology

## 2022-05-31 DIAGNOSIS — Z124 Encounter for screening for malignant neoplasm of cervix: Secondary | ICD-10-CM | POA: Diagnosis not present

## 2022-05-31 DIAGNOSIS — Z01419 Encounter for gynecological examination (general) (routine) without abnormal findings: Secondary | ICD-10-CM | POA: Diagnosis not present

## 2022-05-31 DIAGNOSIS — Z6825 Body mass index (BMI) 25.0-25.9, adult: Secondary | ICD-10-CM | POA: Diagnosis not present

## 2022-05-31 DIAGNOSIS — Z1231 Encounter for screening mammogram for malignant neoplasm of breast: Secondary | ICD-10-CM

## 2022-09-15 ENCOUNTER — Telehealth: Payer: Self-pay | Admitting: Internal Medicine

## 2022-09-15 NOTE — Telephone Encounter (Signed)
Surgery Clearance form from Dr Ramond Marrow at [facility] for patient to have Right Reverse total shoulder replacement on 10/27/2022.  Contact person Silvestre Mesi [phone]. [phone]   CPE was due 04/2022  Last office visit 03/29/22 for lower respiratory infection.  11/12/21 - 6 month follow up

## 2022-09-28 NOTE — Telephone Encounter (Signed)
Appointment scheduled 10/04/2022

## 2022-09-30 ENCOUNTER — Other Ambulatory Visit: Payer: 59

## 2022-09-30 DIAGNOSIS — E7800 Pure hypercholesterolemia, unspecified: Secondary | ICD-10-CM

## 2022-09-30 DIAGNOSIS — Z0000 Encounter for general adult medical examination without abnormal findings: Secondary | ICD-10-CM

## 2022-09-30 NOTE — Addendum Note (Signed)
Addended by: Jama Flavors on: 09/30/2022 09:10 AM   Modules accepted: Orders

## 2022-10-01 LAB — CBC WITH DIFFERENTIAL/PLATELET
Absolute Monocytes: 328 cells/uL (ref 200–950)
Basophils Absolute: 21 cells/uL (ref 0–200)
Basophils Relative: 0.5 %
Eosinophils Absolute: 62 cells/uL (ref 15–500)
Eosinophils Relative: 1.5 %
HCT: 41.2 % (ref 35.0–45.0)
Hemoglobin: 13.3 g/dL (ref 11.7–15.5)
Lymphs Abs: 1476 cells/uL (ref 850–3900)
MCH: 29.2 pg (ref 27.0–33.0)
MCHC: 32.3 g/dL (ref 32.0–36.0)
MCV: 90.5 fL (ref 80.0–100.0)
MPV: 10 fL (ref 7.5–12.5)
Monocytes Relative: 8 %
Neutro Abs: 2214 cells/uL (ref 1500–7800)
Neutrophils Relative %: 54 %
Platelets: 285 10*3/uL (ref 140–400)
RBC: 4.55 10*6/uL (ref 3.80–5.10)
RDW: 12.7 % (ref 11.0–15.0)
Total Lymphocyte: 36 %
WBC: 4.1 10*3/uL (ref 3.8–10.8)

## 2022-10-01 LAB — COMPLETE METABOLIC PANEL WITH GFR
AG Ratio: 2 (calc) (ref 1.0–2.5)
ALT: 14 U/L (ref 6–29)
AST: 20 U/L (ref 10–35)
Albumin: 4.8 g/dL (ref 3.6–5.1)
Alkaline phosphatase (APISO): 78 U/L (ref 37–153)
BUN: 14 mg/dL (ref 7–25)
CO2: 27 mmol/L (ref 20–32)
Calcium: 9.2 mg/dL (ref 8.6–10.4)
Chloride: 102 mmol/L (ref 98–110)
Creat: 0.64 mg/dL (ref 0.50–1.03)
Globulin: 2.4 g/dL (calc) (ref 1.9–3.7)
Glucose, Bld: 83 mg/dL (ref 65–99)
Potassium: 4.4 mmol/L (ref 3.5–5.3)
Sodium: 140 mmol/L (ref 135–146)
Total Bilirubin: 0.4 mg/dL (ref 0.2–1.2)
Total Protein: 7.2 g/dL (ref 6.1–8.1)
eGFR: 102 mL/min/{1.73_m2} (ref >=60)

## 2022-10-01 LAB — LIPID PANEL
Cholesterol: 208 mg/dL — ABNORMAL HIGH (ref <200)
HDL: 66 mg/dL (ref >=50)
LDL Cholesterol (Calc): 113 mg/dL (calc) — ABNORMAL HIGH
Non-HDL Cholesterol (Calc): 142 mg/dL (calc) — ABNORMAL HIGH (ref <130)
Total CHOL/HDL Ratio: 3.2 (calc) (ref <5.0)
Triglycerides: 175 mg/dL — ABNORMAL HIGH (ref <150)

## 2022-10-01 LAB — TSH: TSH: 1.77 mIU/L (ref 0.40–4.50)

## 2022-10-04 ENCOUNTER — Encounter: Payer: Self-pay | Admitting: Internal Medicine

## 2022-10-04 ENCOUNTER — Ambulatory Visit (INDEPENDENT_AMBULATORY_CARE_PROVIDER_SITE_OTHER): Payer: 59 | Admitting: Internal Medicine

## 2022-10-04 VITALS — BP 118/78 | HR 84 | Temp 98.4°F | Resp 16 | Ht 64.0 in | Wt 145.5 lb

## 2022-10-04 DIAGNOSIS — Z0000 Encounter for general adult medical examination without abnormal findings: Secondary | ICD-10-CM | POA: Diagnosis not present

## 2022-10-04 DIAGNOSIS — E7800 Pure hypercholesterolemia, unspecified: Secondary | ICD-10-CM

## 2022-10-04 LAB — POCT URINALYSIS DIPSTICK
Bilirubin, UA: NEGATIVE
Blood, UA: NEGATIVE
Glucose, UA: NEGATIVE
Ketones, UA: NEGATIVE
Leukocytes, UA: NEGATIVE
Nitrite, UA: NEGATIVE
Protein, UA: NEGATIVE
Spec Grav, UA: 1.01 (ref 1.010–1.025)
Urobilinogen, UA: 0.2 E.U./dL
pH, UA: 8 (ref 5.0–8.0)

## 2022-10-04 MED ORDER — ROSUVASTATIN CALCIUM 20 MG PO TABS
20.0000 mg | ORAL_TABLET | Freq: Every day | ORAL | 3 refills | Status: DC
Start: 2022-10-04 — End: 2023-08-17

## 2022-10-04 NOTE — Progress Notes (Signed)
Patient Care Team: Margaree Mackintosh, MD as PCP - General (Internal Medicine)  Visit Date: 10/04/22  Subjective:    Patient ID: Susan Rivas , Female   DOB: 16-Aug-1960, 62 y.o.    MRN: [id number]   62 y.o. Female presents today for annual comprehensive physical exam.   History of hyperlipidemia treated with rosuvastatin 10 mg daily. CHOL elevated at 208, TRIG at 175, LDL at 113.   History of anxiety and depression treated with sertraline 100 mg daily.  In October 2022 fell on slick floor at [employer].  She fell onto her left wrist.  Seen at [facility] on [facility] the following day.  X-ray was unremarkable except for a remote fracture of the tip of the ulnar styloid.  And degenerative changes of the first [facility] joint.  It took some time but patient has recovered from this injury.  She saw Dr. Merlyn Lot and had arthrogram of the left wrist.  Was treated with ibuprofen and then Mobic.  Pain initially was over middle and lower one third of radius but subsequently moved to head of radius and had pain with thumb ROM.  She wore splint for possible scaphoid fracture.  Patient was felt to have arthritis it [facility] joint and no fracture.   History of right shoulder pain. Has had right shoulder arthroscopy, open anterior right shoulder reconstruction. Has not been going to the gym because of this. Planning follow-up surgery 10/27/22.   History of migraine headaches and saw Dr. Lucia Gaskins in July 2020.  MRI of the brain was normal.  Was advised to use Maxalt for acute management.  Past medical history: LEEP procedure for abnormal Pap smear 1992.  Arthroscopy right shoulder 1990.  Right shoulder reconstruction 1991.  Bilateral tubal ligation in 2005.  Intolerant of codeine and hydrocodone.  1 episode of vertigo October 2012 requiring emergency department visit.  Had C-sections and 2004 and in 2005.  Sees Dr. Rana Snare for GYN exam.  Glucose normal. Kidney, liver functions normal. Electrolytes normal. Blood  proteins normal. CBC normal. TSH at 1.77.  Sees Dr. Rana Snare for GYN exam.  Mammogram last completed 06/02/22. No mammographic evidence of malignancy. Recommended repeat in 2025.   Had colonoscopy 2016 with 10-year follow-up recommended.  Social history: She is a Engineer, civil (consulting) and former paramedic.  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  Does not smoke.  Social alcohol consumption.  2 children, son and a daughter.  Husband is a Company secretary.  Family history: Father died of natural causes.  1 sister in good health.  Mother deceased with heart failure.  Past Medical History:  Diagnosis Date   ADD (attention deficit disorder)    "no more"   Hyperlipidemia    Migraines    Tinea versicolor      Family History  Problem Relation Age of Onset   Hypertension Mother    Heart disease Mother    COPD Father    Heart disease Father    Migraines Neg Hx     Social History   Social History Narrative   Lives at home with spouse and 2 children   Right handed   Caffeine: sweet tea about 4 cups (8 oz each) per day          Social history: She is married to a Company secretary.  2 children, son and a daughter.  Does not smoke.  Social alcohol consumption.  She is a Engineer, civil (consulting) and former paramedic.  Family history: Father died of natural causes.  1 sister in good health.  Mother with history of hyperlipidemia.      Review of Systems  Constitutional:  Negative for chills, fever, malaise/fatigue and weight loss.  HENT:  Negative for hearing loss, sinus pain and sore throat.   Respiratory:  Negative for cough, hemoptysis and shortness of breath.   Cardiovascular:  Negative for chest pain, palpitations, leg swelling and PND.  Gastrointestinal:  Negative for abdominal pain, constipation, diarrhea, heartburn, nausea and vomiting.  Genitourinary:  Negative for dysuria, frequency and urgency.  Musculoskeletal:  Negative for back pain, myalgias and neck pain.  Skin:  Negative for itching and rash.   Neurological:  Negative for dizziness, tingling, seizures and headaches.  Endo/Heme/Allergies:  Negative for polydipsia.  Psychiatric/Behavioral:  Negative for depression. The patient is not nervous/anxious.         Objective:   Vitals: BP 118/78   Pulse 84   Temp 98.4 F (36.9 C) (Tympanic)   Resp 16   Ht 5\' 4"  (1.626 m)   Wt 145 lb 8 oz (66 kg)   LMP 02/07/2013   SpO2 97%   BMI 24.98 kg/m    Physical Exam Vitals and nursing note reviewed.  Constitutional:      General: She is not in acute distress.    Appearance: Normal appearance. She is not ill-appearing or toxic-appearing.  HENT:     Head: Normocephalic and atraumatic.     Right Ear: Hearing, tympanic membrane, ear canal and external ear normal.     Left Ear: Hearing, tympanic membrane, ear canal and external ear normal.     Mouth/Throat:     Pharynx: Oropharynx is clear.  Eyes:     Extraocular Movements: Extraocular movements intact.     Pupils: Pupils are equal, round, and reactive to light.  Neck:     Thyroid: No thyroid mass, thyromegaly or thyroid tenderness.     Vascular: No carotid bruit.  Cardiovascular:     Rate and Rhythm: Normal rate and regular rhythm. No extrasystoles are present.    Pulses:          Dorsalis pedis pulses are 1+ on the right side and 1+ on the left side.     Heart sounds: Normal heart sounds. No murmur heard.    No friction rub. No gallop.  Pulmonary:     Effort: Pulmonary effort is normal.     Breath sounds: Normal breath sounds. No decreased breath sounds, wheezing, rhonchi or rales.  Chest:     Chest wall: No mass.  Abdominal:     Palpations: Abdomen is soft. There is no hepatomegaly, splenomegaly or mass.     Tenderness: There is no abdominal tenderness.     Hernia: No hernia is present.  Musculoskeletal:     Cervical back: Normal range of motion.     Right lower leg: No edema.     Left lower leg: No edema.  Lymphadenopathy:     Cervical: No cervical adenopathy.      Upper Body:     Right upper body: No supraclavicular adenopathy.     Left upper body: No supraclavicular adenopathy.  Skin:    General: Skin is warm and dry.  Neurological:     General: No focal deficit present.     Mental Status: She is alert and oriented to person, place, and time. Mental status is at baseline.     Sensory: Sensation is intact.  Motor: Motor function is intact. No weakness.     Deep Tendon Reflexes: Reflexes are normal and symmetric.  Psychiatric:        Attention and Perception: Attention normal.        Mood and Affect: Mood normal.        Speech: Speech normal.        Behavior: Behavior normal.        Thought Content: Thought content normal.        Cognition and Memory: Cognition normal.        Judgment: Judgment normal.       Results:   Studies obtained and personally reviewed by me:  Mammogram last completed 06/02/22. No mammographic evidence of malignancy. Recommended repeat in 2025.   Had colonoscopy 2016 with 10-year follow-up recommended.  Labs:       Component Value Date/Time   NA 140 09/30/2022 0910   NA 141 11/01/2018 1509   K 4.4 09/30/2022 0910   CL 102 09/30/2022 0910   CO2 27 09/30/2022 0910   GLUCOSE 83 09/30/2022 0910   BUN 14 09/30/2022 0910   BUN 13 11/01/2018 1509   CREATININE 0.64 09/30/2022 0910   CALCIUM 9.2 09/30/2022 0910   PROT 7.2 09/30/2022 0910   PROT 6.9 11/01/2018 1509   ALBUMIN 4.5 11/01/2018 1509   AST 20 09/30/2022 0910   ALT 14 09/30/2022 0910   ALKPHOS 93 11/01/2018 1509   BILITOT 0.4 09/30/2022 0910   BILITOT 0.3 11/01/2018 1509   GFRNONAA 98 06/02/2020 0952   GFRAA 113 06/02/2020 0952     Lab Results  Component Value Date   WBC 4.1 09/30/2022   HGB 13.3 09/30/2022   HCT 41.2 09/30/2022   MCV 90.5 09/30/2022   PLT 285 09/30/2022    Lab Results  Component Value Date   CHOL 208 (H) 09/30/2022   HDL 66 09/30/2022   LDLCALC 113 (H) 09/30/2022   TRIG 175 (H) 09/30/2022   CHOLHDL 3.2 09/30/2022     No results found for: "HGBA1C"   Lab Results  Component Value Date   TSH 1.77 09/30/2022      Assessment & Plan:   Hyperlipidemia: increase rosuvastatin to 20 mg daily. CHOL elevated at 208, TRIG at 175, LDL at 113.   Anxiety and depression: stable with sertraline 100 mg daily.  Right shoulder pain: has had right shoulder arthroscopy, open anterior right shoulder reconstruction. Has not been going to the gym because of this. Planning follow-up shoulder surgery 10/27/22.   Sees Dr. Rana Snare for GYN exam.  Mammogram: last completed 06/02/22. No mammographic evidence of malignancy. Recommended repeat in 2025.   Had colonoscopy 2016 with 10-year follow-up recommended.  Vaccine counseling: UTD on tetanus vaccine.    RTC in 6 months for lipid panel, liver functions and office visit.   I,Alexander Ruley,acting as a Neurosurgeon for Margaree Mackintosh, MD.,have documented all relevant documentation on the behalf of Margaree Mackintosh, MD,as directed by  Margaree Mackintosh, MD while in the presence of Margaree Mackintosh, MD.   I, Margaree Mackintosh, MD, have reviewed all documentation for this visit. The documentation on 10/04/22 for the exam, diagnosis, procedures, and orders are all accurate and complete.

## 2022-10-04 NOTE — Patient Instructions (Addendum)
Increase rosuvastatin to 20 mg daily and follow up in 6 months. It was a pleasure to see you today.

## 2022-10-20 ENCOUNTER — Other Ambulatory Visit: Payer: Self-pay | Admitting: Orthopaedic Surgery

## 2022-10-20 DIAGNOSIS — Z01818 Encounter for other preprocedural examination: Secondary | ICD-10-CM

## 2022-10-21 ENCOUNTER — Ambulatory Visit
Admission: RE | Admit: 2022-10-21 | Discharge: 2022-10-21 | Disposition: A | Discharge status: Home / Self Care | Payer: Self-pay | Source: Ambulatory Visit | Attending: Orthopaedic Surgery | Admitting: Orthopaedic Surgery

## 2022-10-21 ENCOUNTER — Ambulatory Visit
Admission: RE | Admit: 2022-10-21 | Discharge: 2022-10-21 | Disposition: A | Discharge status: Home / Self Care | Payer: 59 | Source: Ambulatory Visit | Attending: Orthopaedic Surgery | Admitting: Orthopaedic Surgery

## 2022-10-21 DIAGNOSIS — Z01818 Encounter for other preprocedural examination: Secondary | ICD-10-CM

## 2022-10-21 MED ORDER — IOPAMIDOL (ISOVUE-370) INJECTION 76%: 100.0000 mL | Freq: Once | INTRAVENOUS | Status: DC | PRN

## 2023-03-29 NOTE — Progress Notes (Addendum)
Bye-bye   Patient Care Team: Margaree Mackintosh, MD as PCP - General (Internal Medicine)  Visit Date: 04/05/23  Subjective:    Patient ID: Susan Rivas , Female   DOB: 19-Mar-1961, 62 y.o.    MRN: [id number]   62 y.o. Female presents today for a 46-month follow up. Hx hyperlipidemia treated with statin medication.  History of hyperlipidemia treated with rosuvastatin 20 mg daily. LDL slightly elevated at 105. Liver functions normal.   History of anxiety and depression treated with sertraline 100 mg daily.  History of right shoulder pain. Has had right shoulder arthroscopy, open anterior right shoulder reconstruction.    Sees Dr. Rana Snare for GYN exam.   Mammogram last completed 06/02/22. No mammographic evidence of malignancy. Recommended repeat in 2025.    Had colonoscopy 2016 with 10-year follow-up recommended.  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  Past Medical History:  Diagnosis Date   ADD (attention deficit disorder)    "no more"   Hyperlipidemia    Migraines    Tinea versicolor      Family History  Problem Relation Age of Onset   Hypertension Mother    Heart disease Mother    COPD Father    Heart disease Father    Migraines Neg Hx     Social History   Social History Narrative   Lives at home with spouse and 2 children   Right handed   Caffeine: sweet tea about 4 cups (8 oz each) per day          Social history: She is married to a Company secretary.  2 children, son and a daughter.  Does not smoke.  Social alcohol consumption.  She is a Engineer, civil (consulting) and former paramedic.       Family history: Father died of natural causes.  1 sister in good health.  Mother with history of hyperlipidemia.      Review of Systems  Constitutional:  Negative for fever and malaise/fatigue.  HENT:  Negative for congestion.   Eyes:  Negative for  blurred vision.  Respiratory:  Negative for cough and shortness of breath.   Cardiovascular:  Negative for chest pain, palpitations and leg swelling.  Gastrointestinal:  Negative for vomiting.  Musculoskeletal:  Negative for back pain.  Skin:  Negative for rash.  Neurological:  Negative for loss of consciousness and headaches.        Objective:   Vitals: BP 130/80   Pulse 74   Ht 5\' 4"  (1.626 m)   Wt 147 lb (66.7 kg)   LMP 02/07/2013   SpO2 96%   BMI 25.23 kg/m    Physical Exam Vitals and nursing note reviewed.  Constitutional:      General: She is not in acute distress.    Appearance: Normal appearance. She is not toxic-appearing.  HENT:     Head: Normocephalic and atraumatic.  Neck:     Thyroid: No thyroid mass, thyromegaly or thyroid tenderness.  Cardiovascular:     Rate and Rhythm: Normal rate and regular rhythm. No extrasystoles are present.    Pulses: Normal pulses.     Heart sounds: Normal heart sounds. No murmur heard.    No friction rub. No gallop.  Pulmonary:     Effort: Pulmonary effort is normal. No respiratory distress.     Breath sounds:  Normal breath sounds. No wheezing or rales.  Musculoskeletal:     Right lower leg: No edema.     Left lower leg: No edema.  Skin:    General: Skin is warm and dry.  Neurological:     Mental Status: She is alert and oriented to person, place, and time. Mental status is at baseline.  Psychiatric:        Mood and Affect: Mood normal.        Behavior: Behavior normal.        Thought Content: Thought content normal.        Judgment: Judgment normal.       Results:   Studies obtained and personally reviewed by me:  Mammogram last completed 06/02/22. No mammographic evidence of malignancy. Recommended repeat in 2025.    Had colonoscopy 2016 with 10-year follow-up recommended.  Labs:       Component Value Date/Time   NA 140 09/30/2022 0910   NA 141 11/01/2018 1509   K 4.4 09/30/2022 0910   CL 102 09/30/2022  0910   CO2 27 09/30/2022 0910   GLUCOSE 83 09/30/2022 0910   BUN 14 09/30/2022 0910   BUN 13 11/01/2018 1509   CREATININE 0.64 09/30/2022 0910   CALCIUM 9.2 09/30/2022 0910   PROT 7.0 04/04/2023 0940   PROT 6.9 11/01/2018 1509   ALBUMIN 4.5 11/01/2018 1509   AST 15 04/04/2023 0940   ALT 12 04/04/2023 0940   ALKPHOS 93 11/01/2018 1509   BILITOT 0.4 04/04/2023 0940   BILITOT 0.3 11/01/2018 1509   GFRNONAA 98 06/02/2020 0952   GFRAA 113 06/02/2020 0952     Lab Results  Component Value Date   WBC 4.1 09/30/2022   HGB 13.3 09/30/2022   HCT 41.2 09/30/2022   MCV 90.5 09/30/2022   PLT 285 09/30/2022    Lab Results  Component Value Date   CHOL 195 04/04/2023   HDL 67 04/04/2023   LDLCALC 105 (H) 04/04/2023   TRIG 131 04/04/2023   CHOLHDL 2.9 04/04/2023    No results found for: "HGBA1C"   Lab Results  Component Value Date   TSH 1.77 09/30/2022      Assessment & Plan:   Hyperlipidemia: continue rosuvastatin 20 mg daily.Labs are WNL. LDL is 105.   Anxiety and depression: stable with sertraline 100 mg daily.  Vaccine counseling: declines flu vaccine  Hx of migraine headaches stable with treatment  Return in 6 months for health maintenance exam or as needed.    I,Alexander Ruley,acting as a Neurosurgeon for Margaree Mackintosh, MD.,have documented all relevant documentation on the behalf of Margaree Mackintosh, MD,as directed by  Margaree Mackintosh, MD while in the presence of Margaree Mackintosh, MD.  I, Margaree Mackintosh, MD, have reviewed all documentation for this visit. The documentation on 04/05/23 for the exam, diagnosis, procedures, and orders are all accurate and complete.

## 2023-04-04 ENCOUNTER — Other Ambulatory Visit: Payer: 59

## 2023-04-04 DIAGNOSIS — E7800 Pure hypercholesterolemia, unspecified: Secondary | ICD-10-CM

## 2023-04-05 ENCOUNTER — Ambulatory Visit: Payer: 59 | Admitting: Internal Medicine

## 2023-04-05 ENCOUNTER — Encounter: Payer: Self-pay | Admitting: Internal Medicine

## 2023-04-05 VITALS — BP 130/80 | HR 74 | Ht 64.0 in | Wt 147.0 lb

## 2023-04-05 DIAGNOSIS — E7800 Pure hypercholesterolemia, unspecified: Secondary | ICD-10-CM | POA: Diagnosis not present

## 2023-04-05 DIAGNOSIS — F32A Depression, unspecified: Secondary | ICD-10-CM

## 2023-04-05 DIAGNOSIS — Z8669 Personal history of other diseases of the nervous system and sense organs: Secondary | ICD-10-CM | POA: Diagnosis not present

## 2023-04-05 DIAGNOSIS — F419 Anxiety disorder, unspecified: Secondary | ICD-10-CM | POA: Diagnosis not present

## 2023-04-05 LAB — LIPID PANEL
Cholesterol: 195 mg/dL (ref <200)
HDL: 67 mg/dL (ref >=50)
LDL Cholesterol (Calc): 105 mg/dL — ABNORMAL HIGH
Non-HDL Cholesterol (Calc): 128 mg/dL (ref <130)
Total CHOL/HDL Ratio: 2.9 (calc) (ref <5.0)
Triglycerides: 131 mg/dL (ref <150)

## 2023-04-05 LAB — HEPATIC FUNCTION PANEL
AG Ratio: 1.9 (calc) (ref 1.0–2.5)
ALT: 12 U/L (ref 6–29)
AST: 15 U/L (ref 10–35)
Albumin: 4.6 g/dL (ref 3.6–5.1)
Alkaline phosphatase (APISO): 82 U/L (ref 37–153)
Bilirubin, Direct: 0 mg/dL (ref 0.0–0.2)
Globulin: 2.4 g/dL (ref 1.9–3.7)
Indirect Bilirubin: 0.4 mg/dL (ref 0.2–1.2)
Total Bilirubin: 0.4 mg/dL (ref 0.2–1.2)
Total Protein: 7 g/dL (ref 6.1–8.1)

## 2023-04-05 NOTE — Patient Instructions (Signed)
Continue rosuvastatin 20 mg daily.  Continue sertraline.  Patient declines flu vaccine today.  Migraine headaches are stable with treatment.  Return in 6 months for health maintenance exam or as needed.

## 2023-07-15 LAB — CYTOLOGY - PAP

## 2023-08-16 ENCOUNTER — Other Ambulatory Visit: Payer: Self-pay | Admitting: Internal Medicine

## 2023-08-17 ENCOUNTER — Other Ambulatory Visit: Payer: Self-pay

## 2023-08-17 DIAGNOSIS — E7800 Pure hypercholesterolemia, unspecified: Secondary | ICD-10-CM

## 2023-08-17 MED ORDER — ROSUVASTATIN CALCIUM 20 MG PO TABS: 20.0000 mg | ORAL_TABLET | Freq: Every day | ORAL | 3 refills | Status: AC

## 2023-08-19 ENCOUNTER — Ambulatory Visit
Admission: RE | Admit: 2023-08-19 | Discharge: 2023-08-19 | Disposition: A | Discharge status: Home / Self Care | Source: Ambulatory Visit | Attending: Obstetrics and Gynecology | Admitting: Obstetrics and Gynecology

## 2023-08-19 ENCOUNTER — Other Ambulatory Visit: Payer: Self-pay | Admitting: Obstetrics and Gynecology

## 2023-08-19 DIAGNOSIS — Z1231 Encounter for screening mammogram for malignant neoplasm of breast: Secondary | ICD-10-CM

## 2023-08-22 ENCOUNTER — Other Ambulatory Visit: Payer: Self-pay | Admitting: Obstetrics and Gynecology

## 2023-08-22 ENCOUNTER — Ambulatory Visit
Admission: RE | Admit: 2023-08-22 | Discharge: 2023-08-22 | Disposition: A | Discharge status: Home / Self Care | Source: Ambulatory Visit | Attending: Obstetrics and Gynecology | Admitting: Obstetrics and Gynecology

## 2023-08-22 DIAGNOSIS — M8580 Other specified disorders of bone density and structure, unspecified site: Secondary | ICD-10-CM

## 2023-08-24 ENCOUNTER — Other Ambulatory Visit: Payer: Self-pay | Admitting: Obstetrics and Gynecology

## 2023-08-24 DIAGNOSIS — R928 Other abnormal and inconclusive findings on diagnostic imaging of breast: Secondary | ICD-10-CM

## 2023-09-02 ENCOUNTER — Encounter (HOSPITAL_COMMUNITY): Payer: Self-pay

## 2023-09-09 ENCOUNTER — Ambulatory Visit
Admission: RE | Admit: 2023-09-09 | Discharge: 2023-09-09 | Disposition: A | Discharge status: Home / Self Care | Source: Ambulatory Visit | Attending: Obstetrics and Gynecology | Admitting: Obstetrics and Gynecology

## 2023-09-09 DIAGNOSIS — R928 Other abnormal and inconclusive findings on diagnostic imaging of breast: Secondary | ICD-10-CM

## 2023-09-30 ENCOUNTER — Other Ambulatory Visit: Payer: 59

## 2023-09-30 DIAGNOSIS — F32A Depression, unspecified: Secondary | ICD-10-CM

## 2023-09-30 DIAGNOSIS — M8580 Other specified disorders of bone density and structure, unspecified site: Secondary | ICD-10-CM

## 2023-09-30 DIAGNOSIS — E7800 Pure hypercholesterolemia, unspecified: Secondary | ICD-10-CM

## 2023-09-30 DIAGNOSIS — Z0000 Encounter for general adult medical examination without abnormal findings: Secondary | ICD-10-CM

## 2023-10-01 LAB — CBC WITH DIFFERENTIAL/PLATELET
Absolute Lymphocytes: 1652 {cells}/uL (ref 850–3900)
Absolute Monocytes: 252 {cells}/uL (ref 200–950)
Basophils Absolute: 20 {cells}/uL (ref 0–200)
Basophils Relative: 0.5 %
Eosinophils Absolute: 52 {cells}/uL (ref 15–500)
Eosinophils Relative: 1.3 %
HCT: 41.5 % (ref 35.0–45.0)
Hemoglobin: 13.1 g/dL (ref 11.7–15.5)
MCH: 29 pg (ref 27.0–33.0)
MCHC: 31.6 g/dL — ABNORMAL LOW (ref 32.0–36.0)
MCV: 91.8 fL (ref 80.0–100.0)
MPV: 10.1 fL (ref 7.5–12.5)
Monocytes Relative: 6.3 %
Neutro Abs: 2024 {cells}/uL (ref 1500–7800)
Neutrophils Relative %: 50.6 %
Platelets: 281 10*3/uL (ref 140–400)
RBC: 4.52 10*6/uL (ref 3.80–5.10)
RDW: 12.7 % (ref 11.0–15.0)
Total Lymphocyte: 41.3 %
WBC: 4 10*3/uL (ref 3.8–10.8)

## 2023-10-01 LAB — COMPLETE METABOLIC PANEL WITHOUT GFR
AG Ratio: 1.7 (calc) (ref 1.0–2.5)
ALT: 10 U/L (ref 6–29)
AST: 16 U/L (ref 10–35)
Albumin: 4.7 g/dL (ref 3.6–5.1)
Alkaline phosphatase (APISO): 84 U/L (ref 37–153)
BUN: 11 mg/dL (ref 7–25)
CO2: 28 mmol/L (ref 20–32)
Calcium: 9.4 mg/dL (ref 8.6–10.4)
Chloride: 104 mmol/L (ref 98–110)
Creat: 0.62 mg/dL (ref 0.50–1.05)
Globulin: 2.7 g/dL (ref 1.9–3.7)
Glucose, Bld: 82 mg/dL (ref 65–99)
Potassium: 4.2 mmol/L (ref 3.5–5.3)
Sodium: 140 mmol/L (ref 135–146)
Total Bilirubin: 0.4 mg/dL (ref 0.2–1.2)
Total Protein: 7.4 g/dL (ref 6.1–8.1)

## 2023-10-01 LAB — TSH: TSH: 2.15 m[IU]/L (ref 0.40–4.50)

## 2023-10-01 LAB — LIPID PANEL
Cholesterol: 197 mg/dL (ref <200)
HDL: 74 mg/dL (ref >=50)
LDL Cholesterol (Calc): 96 mg/dL
Non-HDL Cholesterol (Calc): 123 mg/dL (ref <130)
Total CHOL/HDL Ratio: 2.7 (calc) (ref <5.0)
Triglycerides: 167 mg/dL — ABNORMAL HIGH (ref <150)

## 2023-10-02 ENCOUNTER — Ambulatory Visit: Payer: Self-pay | Admitting: Internal Medicine

## 2023-10-06 ENCOUNTER — Encounter: Payer: Self-pay | Admitting: Internal Medicine

## 2023-10-06 NOTE — Progress Notes (Addendum)
 Annual Wellness Visit   Patient Care Team: Perri Ronal PARAS, MD as PCP - General (Internal Medicine)  Visit Date: 10/06/23   Subjective:  Patient: Susan Rivas, Female DOB: 1960/06/26, 63 y.o. MRN: [id number] Susan Rivas is a 63 y.o. Female who presents today for her Annual Wellness Visit. Patient has Fatigue; Hyperlipidemia; Depression; Attention Deficit Disorder; History Of Migraine Headaches w/p Aura and w/ Status Migrainosus, Not Intractable.  History of Hyperlipidemia treated with Rosuvastatin  20 mg daily. 09/30/2023 Lipid Panel, compared to 09/2022: Triglycerides 167, decreased from 175; otherwise WNL.    S/p Right Shoulder Arthroscopy in 1990; S/p Open Anterior Shoulder Reconstruction, Right in 1991; History of Right Shoulder Pain managed with Flexeril  10 mg three times daily as needed.  History of Migraine Headaches evaluated by Dr. Ines July 2020. Brain MRI 11/2018 was normal.   History of Anxiety/Depression treated with Zoloft  100 mg daily.   Labs 09/30/2023 CBC, compared to 09/2022: MCHC 31.6, decreased from 32.3; otherwise WNL.   CMP: WNL  TSH: 2.16  History of LEEP 1992 following abnormal Pap; last PAP Smear via Dr. Marget 07/13/2023  normal. Using Estradiol for Hormone Replacement Therapy.   Initial Mammogram 08/19/2023 with possible asymmetry in the left breast, but otherwise normal findings; subsequent right mammogram 09/09/2023 normal and US  found asymmetry in lateral retroareolar region disperses on additional imaging w/o suspicious masses or malignant type microcalcification identified with repeat recommendation of 2026.  Colonoscopy 07/25/2014 with repeat recommendation of 2026.  Bone Density will be due 2029.   Vaccine Counseling: Due for Shingles 1/2; UTD on Flu and Tdap.  Past Medical History:  Diagnosis Date   ADD (attention deficit disorder)    no more   Hyperlipidemia    Migraines    Tinea versicolor    Medical/Surgical History Narrative:    Allergic/Intolerant to:  Allergies  Allergen Reactions   Codeine     Pt denies    Hydrocodone      Pt denies   2022 - In October fell on a slick floor at [employer], landing on her left wrist; subsequently seen at [facility] on [employer] the next day. X-ray unremarkable, noted a remote fracture at the tip of the ulnar styloid and degenerative changes of 1st [facility] joint. She did eventually recover from this. Did see Dr. Murrell and had arthrogram of the left wrist done. Pain was treated with Ibuprofen and then Mobic, was initially over middle and lower 1/3 of radius but moved to head of radius and had pain with thumb ROM. Wore a splint for possible scaphoid fracture. It was felt she had arthritis in [facility] joint, but no fracture.   2012 - seen in ED for Vertigo  2005 - Cesarean Section; Bilateral Tubal Ligation  2004 - Cesarean Section Past Surgical History:  Procedure Laterality Date   BREAST BIOPSY Right 2014   LEEP     OPEN ANTERIOR SHOULDER RECONSTRUCTION     right shoulder   SHOULDER ARTHROSCOPY     right    TUBAL LIGATION     Family History  Problem Relation Age of Onset   Hypertension Mother    Heart disease Mother    COPD Father    Heart disease Father    Migraines Neg Hx    Family History Narrative:  Social History   Social History Narrative   Lives at home with spouse and 2 children   Right handed   Caffeine: sweet tea about 4 cups (8 oz each) per  day      Married - husband is a Company secretary. 2 children - 1 son and 1 daughter. Non-smoker, social alcohol consumption. 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       Fhx:   No Family History of Migraines   Father, deceased due to natural causes, w/ hx of COPD and Heart Disease   Mother, deceased due to Heart Failure, w/ hx of Heart Disease, Hypertension, and Hyperlipidemia   1 Sister in good health as far as is known    ROS  Objective:  Vitals: LMP 02/07/2013  Physical Exam Most Recent  Functional Status Assessment:     No data to display         Most Recent Fall Risk Assessment:    10/04/2022    9:58 AM  Fall Risk   Falls in the past year? 0  Number falls in past yr: 0  Injury with Fall? 0  Risk for fall due to : No Fall Risks   Most Recent Depression Screenings:    04/05/2023    9:31 AM 03/29/2022   12:04 PM  PHQ 2/9 Scores  PHQ - 2 Score 0 0   Most Recent Cognitive Screening:     No data to display         Results:  Studies Obtained And Personally Reviewed By Me:  PAP Smear via Dr. Marget 07/13/2023  normal.   Initial Mammogram 08/19/2023 with possible asymmetry in the left breast, but otherwise normal findings; subsequent right mammogram 09/09/2023 normal and US  found asymmetry in lateral retroareolar region disperses on additional imaging w/o suspicious masses or malignant type microcalcification identified.  Colonoscopy 07/25/2014 with repeat recommendation of 2026.  Labs:     Component Value Date/Time   NA 140 09/30/2023 0932   NA 141 11/01/2018 1509   K 4.2 09/30/2023 0932   CL 104 09/30/2023 0932   CO2 28 09/30/2023 0932   GLUCOSE 82 09/30/2023 0932   BUN 11 09/30/2023 0932   BUN 13 11/01/2018 1509   CREATININE 0.62 09/30/2023 0932   CALCIUM  9.4 09/30/2023 0932   PROT 7.4 09/30/2023 0932   PROT 6.9 11/01/2018 1509   ALBUMIN 4.5 11/01/2018 1509   AST 16 09/30/2023 0932   ALT 10 09/30/2023 0932   ALKPHOS 93 11/01/2018 1509   BILITOT 0.4 09/30/2023 0932   BILITOT 0.3 11/01/2018 1509   GFRNONAA 98 06/02/2020 0952   GFRAA 113 06/02/2020 0952    Lab Results  Component Value Date   WBC 4.0 09/30/2023   HGB 13.1 09/30/2023   HCT 41.5 09/30/2023   MCV 91.8 09/30/2023   PLT 281 09/30/2023   Lab Results  Component Value Date   CHOL 197 09/30/2023   HDL 74 09/30/2023   LDLCALC 96 09/30/2023   TRIG 167 (H) 09/30/2023   CHOLHDL 2.7 09/30/2023   Lab Results  Component Value Date   TSH 2.15 09/30/2023    Assessment & Plan:    Normal health maintenance exam  Slightly elevated triglycerides of 167.  History of pure hypercholesterolemia.  She is on rosuvastatin  20 mg daily.  Continue to watch diet.    She is physically active.   History of migraine headaches which were infrequent.    Anxiety stable with sertraline .    Is on a Evista per GYN, Dr. Marget.  Mammogram was done May 2025.  Cannot find bone density study on file.  Discussed with patient.  She is reluctant  to do so.  Plan: Return in 1 year or as needed.  No change in medications.      Annual wellness visit done today including the all of the following: Reviewed patient's Family Medical History Reviewed and updated list of patient's medical providers Assessment of cognitive impairment was done Assessed patient's functional ability Established a written schedule for health screening services Health Risk Assessent Completed and Reviewed  Discussed health benefits of physical activity, and encouraged her to engage in regular exercise appropriate for her age and condition.    I,Emily Lagle,acting as a Neurosurgeon for Ronal JINNY Hailstone, MD.,have documented all relevant documentation on the behalf of Ronal JINNY Hailstone, MD,as directed by  Ronal JINNY Hailstone, MD while in the presence of Ronal JINNY Hailstone, MD.  I, Ronal JINNY Hailstone, MD, have reviewed all documentation for this visit. The documentation on 10/22/23 for the exam, diagnosis, procedures, and orders are all accurate and complete.

## 2023-10-07 ENCOUNTER — Ambulatory Visit: Payer: 59 | Admitting: Internal Medicine

## 2023-10-07 VITALS — BP 122/84 | HR 81 | Temp 98.3°F | Ht 64.0 in | Wt 146.8 lb

## 2023-10-07 DIAGNOSIS — E7800 Pure hypercholesterolemia, unspecified: Secondary | ICD-10-CM

## 2023-10-07 DIAGNOSIS — F419 Anxiety disorder, unspecified: Secondary | ICD-10-CM

## 2023-10-07 DIAGNOSIS — Z8669 Personal history of other diseases of the nervous system and sense organs: Secondary | ICD-10-CM | POA: Diagnosis not present

## 2023-10-07 DIAGNOSIS — F32A Depression, unspecified: Secondary | ICD-10-CM | POA: Diagnosis not present

## 2023-10-07 DIAGNOSIS — Z0000 Encounter for general adult medical examination without abnormal findings: Secondary | ICD-10-CM | POA: Diagnosis not present

## 2023-10-07 LAB — POC URINALSYSI DIPSTICK (AUTOMATED)
Bilirubin, UA: NEGATIVE
Blood, UA: NEGATIVE
Glucose, UA: NEGATIVE
Ketones, UA: NEGATIVE
Leukocytes, UA: NEGATIVE
Nitrite, UA: NEGATIVE
Protein, UA: NEGATIVE
Spec Grav, UA: <=1.005 — AB (ref 1.010–1.025)
Urobilinogen, UA: 0.2 U/dL
pH, UA: 7 (ref 5.0–8.0)

## 2023-10-14 ENCOUNTER — Other Ambulatory Visit: Payer: Self-pay

## 2023-10-22 ENCOUNTER — Encounter: Payer: Self-pay | Admitting: Internal Medicine

## 2023-10-22 NOTE — Patient Instructions (Signed)
 It was a pleasure to see you today.  Continue current medications and return in 1 year or as needed.  Bone density study discussed and patient prefers to defer this at this time.

## 2024-03-11 ENCOUNTER — Encounter: Payer: Self-pay | Admitting: Internal Medicine

## 2024-04-03 ENCOUNTER — Ambulatory Visit: Admitting: Internal Medicine

## 2024-04-03 ENCOUNTER — Encounter: Payer: Self-pay | Admitting: Internal Medicine

## 2024-04-03 VITALS — BP 130/80 | HR 88 | Temp 97.8°F | Ht 64.0 in | Wt 149.0 lb

## 2024-04-03 DIAGNOSIS — H6503 Acute serous otitis media, bilateral: Secondary | ICD-10-CM | POA: Diagnosis not present

## 2024-04-03 DIAGNOSIS — H8112 Benign paroxysmal vertigo, left ear: Secondary | ICD-10-CM

## 2024-04-03 MED ORDER — ALPRAZOLAM 0.5 MG PO TABS: ORAL_TABLET | ORAL | 0 refills | Status: AC

## 2024-04-03 MED ORDER — AZITHROMYCIN 250 MG PO TABS: ORAL_TABLET | ORAL | 0 refills | Status: DC

## 2024-04-03 NOTE — Progress Notes (Addendum)
 "   Patient Care Team: Perri Ronal PARAS, MD as PCP - General (Internal Medicine)  Visit Date: 04/03/24  Subjective:    Patient ID: Susan Rivas , Female   DOB: [birth year], 63 y.o.    MRN: [id number]   63 y.o. Female presents today for Dizziness. Patient has a past medical history of Hyperlipidemia, Anxiety/depression.  She has been dizzy for 3 days. She believed she might have an inner ear infection. She was sick a week or two ago. She had a runny nose but no sore throat. She treated the dizziness with dramamine but that did not bring her relief. She said that If she moves her head too fast she will get dizzy. Physical exam revealed fullness in both ears and a rightward nystagmus.    Past Medical History:  Diagnosis Date   ADD (attention deficit disorder)    no more   Hyperlipidemia    Migraines    Tinea versicolor      Family History  Problem Relation Age of Onset   Hypertension Mother    Heart disease Mother    COPD Father    Heart disease Father    Migraines Neg Hx     Social History   Social History Narrative   Lives at home with spouse and 2 children   Right handed   Caffeine: sweet tea about 4 cups (8 oz each) per day      Married - husband is a company secretary. 2 children - 1 son and 1 daughter. Non-smoker, social alcohol consumption. 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       Fhx:   No Family History of Migraines   Father, deceased due to natural causes, w/ hx of COPD and Heart Disease   Mother, deceased due to Heart Failure, w/ hx of Heart Disease, Hypertension, and Hyperlipidemia   1 Sister in good health as far as is known       Review of Systems  Neurological:  Positive for dizziness.        Objective:   Vitals: BP 130/80   Pulse 88   Temp 97.8 F (36.6 C)   Ht 5' 4 (1.626 m)   Wt 149 lb (67.6 kg)   LMP 02/07/2013   SpO2 98%   BMI 25.58 kg/m    Physical Exam Vitals and nursing note reviewed.   Constitutional:      General: She is not in acute distress.    Appearance: Normal appearance. She is not ill-appearing.  HENT:     Head: Normocephalic and atraumatic.     Right Ear: Tympanic membrane, ear canal and external ear normal.     Left Ear: Tympanic membrane, ear canal and external ear normal.     Ears:     Comments: Both ears full.     Mouth/Throat:     Mouth: Mucous membranes are moist.     Pharynx: Oropharynx is clear. No oropharyngeal exudate or posterior oropharyngeal erythema.  Eyes:     Pupils: Pupils are equal, round, and reactive to light.     Comments: Rightward nystagmus.   EOM full   Pulmonary:     Effort: Pulmonary effort is normal.  Skin:    General: Skin is warm and dry.  Neurological:     Mental Status: She is alert and oriented to person, place, and time. Mental status is at baseline.  Psychiatric:        Mood  and Affect: Mood normal.        Behavior: Behavior normal.        Thought Content: Thought content normal.        Judgment: Judgment normal.       Results:    Labs:       Component Value Date/Time   NA 140 09/30/2023 0932   NA 141 11/01/2018 1509   K 4.2 09/30/2023 0932   CL 104 09/30/2023 0932   CO2 28 09/30/2023 0932   GLUCOSE 82 09/30/2023 0932   BUN 11 09/30/2023 0932   BUN 13 11/01/2018 1509   CREATININE 0.62 09/30/2023 0932   CALCIUM  9.4 09/30/2023 0932   PROT 7.4 09/30/2023 0932   PROT 6.9 11/01/2018 1509   ALBUMIN 4.5 11/01/2018 1509   AST 16 09/30/2023 0932   ALT 10 09/30/2023 0932   ALKPHOS 93 11/01/2018 1509   BILITOT 0.4 09/30/2023 0932   BILITOT 0.3 11/01/2018 1509   GFRNONAA 98 06/02/2020 0952   GFRAA 113 06/02/2020 0952     Lab Results  Component Value Date   WBC 4.0 09/30/2023   HGB 13.1 09/30/2023   HCT 41.5 09/30/2023   MCV 91.8 09/30/2023   PLT 281 09/30/2023    Lab Results  Component Value Date   CHOL 197 09/30/2023   HDL 74 09/30/2023   LDLCALC 96 09/30/2023   TRIG 167 (H) 09/30/2023    CHOLHDL 2.7 09/30/2023     Lab Results  Component Value Date   TSH 2.15 09/30/2023         Assessment & Plan:   Meds ordered this encounter  Medications   ALPRAZolam  (XANAX ) 0.5 MG tablet    Sig: One half to one tablet three times daily as needed for vertigo    Dispense:  30 tablet    Refill:  0   azithromycin  (ZITHROMAX  Z-PAK) 250 MG tablet    Sig: Take 2 tablets (500 mg) on  Day 1,  followed by 1 tablet (250 mg) once daily on Days 2 through 5.    Dispense:  6 tablet    Refill:  0    Benign positional vertigo: She has been dizzy for 3 days. She believed she might have an inner ear infection. She was sick a week or two ago. She had a runny nose but no sore throat. She treated the dizziness with dramamine but that did not bring her relief. She said that If she moves her head too fast she will get dizzy. Physical exam revealed fullness in both ears and a rightward nystagmus.    Azithromycin  250 mg two tablets on day one, one tablet daily on days 2-5 prescribed.   Xanax  0.5 mg daily prescribed.   Serous otitis media of both ears  I,Makayla C Reid,acting as a scribe for Ronal JINNY Hailstone, MD.,have documented all relevant documentation on the behalf of Ronal JINNY Hailstone, MD,as directed by  Ronal JINNY Hailstone, MD while in the presence of Ronal JINNY Hailstone, MD.  I, Ronal JINNY Hailstone, MD, have reviewed all documentation for this visit. The documentation on 04/03/2024 for the exam, diagnosis, procedures, and orders are all accurate and complete.    "

## 2024-04-18 NOTE — Patient Instructions (Addendum)
 We are sorry you are not feeling well. Exam reveals bilateral serous otitis media with leftward nystagmus. Please take xanax  sparingly up to 3 times daily for vertigo symptoms. Please take Zithromax  z pak for otitis media. Call if not better in 7-10 days or sooner if worse.

## 2024-06-01 ENCOUNTER — Ambulatory Visit: Admitting: Internal Medicine

## 2024-06-01 VITALS — BP 140/82 | HR 84 | Ht 64.0 in | Wt 147.0 lb

## 2024-06-01 DIAGNOSIS — R42 Dizziness and giddiness: Secondary | ICD-10-CM

## 2024-06-01 DIAGNOSIS — G43011 Migraine without aura, intractable, with status migrainosus: Secondary | ICD-10-CM

## 2024-06-01 MED ORDER — METHYLPREDNISOLONE 4 MG PO TBPK: ORAL_TABLET | ORAL | 0 refills | Status: AC

## 2024-06-01 MED ORDER — METHOCARBAMOL 750 MG PO TABS: 750.0000 mg | ORAL_TABLET | Freq: Four times a day (QID) | ORAL | 1 refills | Status: AC

## 2024-06-01 MED ORDER — ONDANSETRON HCL 4 MG PO TABS: 4.0000 mg | ORAL_TABLET | Freq: Three times a day (TID) | ORAL | 1 refills | Status: AC | PRN

## 2024-06-01 NOTE — Progress Notes (Shared)
 "   Patient Care Team: Perri Ronal PARAS, MD as PCP - General (Internal Medicine)  Visit Date: 06/01/24  Subjective:    Patient ID: Susan Rivas , Female   DOB: 08/03/1960, 64 y.o.    MRN: [id number]   64 y.o. Female presents today for Migraine. Patient has a past medical history of musculoskeletal pain, Hyperlipidemia, Anxiety .  History of musculoskeletal pain; S/p Right Shoulder Arthroscopy in 1990; S/p Open Anterior Shoulder Reconstruction, Right in 1991; History of Right Shoulder Pain managed with Flexeril  10 mg three times daily as needed.   History of Migraine Headaches evaluated by Dr. Ines July 2020. Brain MRI 11/2018 was normal.  She complains of Migraine headaches and dizziness. She has been experiencing an migraine for a few days.   She was last seen here on 04/03/2024 for benign positional vertigo. She was prescribed Xanax  0.5 mg daily and Azithromycin  250 mg. She said that she is still experiencing dizziness. Physical exam did not show nystagmus.     Past Medical History:  Diagnosis Date   ADD (attention deficit disorder)    no more   Hyperlipidemia    Migraines    Tinea versicolor      Family History  Problem Relation Age of Onset   Hypertension Mother    Heart disease Mother    COPD Father    Heart disease Father    Migraines Neg Hx    Fhx:   No Family History of Migraines   Father, deceased due to natural causes, w/ hx of COPD and Heart Disease   Mother, deceased due to Heart Failure, w/ hx of Heart Disease, Hypertension, and Hyperlipidemia   1 Sister in good health as far as is known    Social History   Social History Narrative   Lives at home with spouse and 2 children   Right handed   Caffeine: sweet tea about 4 cups (8 oz each) per day      Married - husband is a company secretary. 2 children - 1 son and 1 daughter. Non-smoker, social alcohol consumption. 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       Fhx:   No Family  History of Migraines   Father, deceased due to natural causes, w/ hx of COPD and Heart Disease   Mother, deceased due to Heart Failure, w/ hx of Heart Disease, Hypertension, and Hyperlipidemia   1 Sister in good health as far as is known       Review of Systems  Neurological:  Positive for dizziness and headaches.        Objective:   Vitals: BP (!) 140/82   Pulse 84   Ht 5' 4 (1.626 m)   Wt 147 lb (66.7 kg)   LMP 02/07/2013   SpO2 97%   BMI 25.23 kg/m    Physical Exam Eyes:     Extraocular Movements:     Right eye: No nystagmus.     Left eye: No nystagmus.  Neurological:     General: No focal deficit present.     Mental Status: She is alert and oriented to person, place, and time.       Results:   Studies obtained and personally reviewed by me:   Labs:       Component Value Date/Time   NA 140 09/30/2023 0932   NA 141 11/01/2018 1509   K 4.2 09/30/2023 0932   CL 104 09/30/2023 0932  CO2 28 09/30/2023 0932   GLUCOSE 82 09/30/2023 0932   BUN 11 09/30/2023 0932   BUN 13 11/01/2018 1509   CREATININE 0.62 09/30/2023 0932   CALCIUM  9.4 09/30/2023 0932   PROT 7.4 09/30/2023 0932   PROT 6.9 11/01/2018 1509   ALBUMIN 4.5 11/01/2018 1509   AST 16 09/30/2023 0932   ALT 10 09/30/2023 0932   ALKPHOS 93 11/01/2018 1509   BILITOT 0.4 09/30/2023 0932   BILITOT 0.3 11/01/2018 1509   GFRNONAA 98 06/02/2020 0952   GFRAA 113 06/02/2020 0952     Lab Results  Component Value Date   WBC 4.0 09/30/2023   HGB 13.1 09/30/2023   HCT 41.5 09/30/2023   MCV 91.8 09/30/2023   PLT 281 09/30/2023    Lab Results  Component Value Date   CHOL 197 09/30/2023   HDL 74 09/30/2023   LDLCALC 96 09/30/2023   TRIG 167 (H) 09/30/2023   CHOLHDL 2.7 09/30/2023     Lab Results  Component Value Date   TSH 2.15 09/30/2023        Assessment & Plan:   Meds ordered this encounter  Medications   methocarbamol  (ROBAXIN ) 750 MG tablet    Sig: Take 1 tablet (750 mg total)  by mouth 4 (four) times daily.    Dispense:  30 tablet    Refill:  1   methylPREDNISolone  (MEDROL  DOSEPAK) 4 MG TBPK tablet    Sig: Take in tapering course as directed 6-5-4-3-2-1    Dispense:  21 tablet    Refill:  0   ondansetron  (ZOFRAN ) 4 MG tablet    Sig: Take 1 tablet (4 mg total) by mouth every 8 (eight) hours as needed for nausea or vomiting.    Dispense:  20 tablet    Refill:  1    musculoskeletal pain: S/p Right Shoulder Arthroscopy in 1990; S/p Open Anterior Shoulder Reconstruction, Right in 1991; History of Right Shoulder Pain managed with Flexeril  10 mg three times daily as needed.    Robaxin  750 mg four times daily prescribed.   Intractable migraine without aura and with status migrainosus. History of Migraine Headaches evaluated by Dr. Ines July 2020. Brain MRI 11/2018 was normal.  She complains of Migraine headaches and dizziness. She has been experiencing an migraine for a few days.   Zofran  4 mg every 8 hours as needed for nausea prescribed. Medrol  dose pack 4 mg tapering course 6-5-4-3-2-1 prescribed.   Vertigo: She was last seen here on 04/03/2024 for benign positional vertigo. She was prescribed Xanax  0.5 mg daily and Azithromycin  250 mg. She said that she is still experiencing dizziness. Physical exam did not show nystagmus.    I,Makayla C Reid,acting as a scribe for Ronal JINNY Hailstone, MD.,have documented all relevant documentation on the behalf of Ronal JINNY Hailstone, MD,as directed by  Ronal JINNY Hailstone, MD while in the presence of Ronal JINNY Hailstone, MD.      "

## 2024-10-11 ENCOUNTER — Other Ambulatory Visit: Payer: Self-pay

## 2024-10-12 ENCOUNTER — Encounter: Payer: Self-pay | Admitting: Internal Medicine
